# Patient Record
Sex: Male | Born: 1950 | Race: White | Hispanic: No | Marital: Married | Smoking: Never smoker
Health system: Southern US, Community
[De-identification: ages and names within clinical notes are randomized; demographics above are authoritative.]

## PROBLEM LIST (undated history)

## (undated) DIAGNOSIS — Z951 Presence of aortocoronary bypass graft: Secondary | ICD-10-CM

## (undated) DIAGNOSIS — I10 Essential (primary) hypertension: Secondary | ICD-10-CM

## (undated) DIAGNOSIS — Q359 Cleft palate, unspecified: Secondary | ICD-10-CM

## (undated) DIAGNOSIS — Z9889 Other specified postprocedural states: Secondary | ICD-10-CM

## (undated) DIAGNOSIS — K7460 Unspecified cirrhosis of liver: Secondary | ICD-10-CM

## (undated) DIAGNOSIS — I2510 Atherosclerotic heart disease of native coronary artery without angina pectoris: Secondary | ICD-10-CM

## (undated) DIAGNOSIS — I739 Peripheral vascular disease, unspecified: Secondary | ICD-10-CM

## (undated) DIAGNOSIS — N529 Male erectile dysfunction, unspecified: Secondary | ICD-10-CM

## (undated) DIAGNOSIS — I779 Disorder of arteries and arterioles, unspecified: Secondary | ICD-10-CM

## (undated) HISTORY — DX: Presence of aortocoronary bypass graft: Z95.1

## (undated) HISTORY — DX: Disorder of arteries and arterioles, unspecified: I77.9

## (undated) HISTORY — DX: Cleft palate, unspecified: Q35.9

## (undated) HISTORY — DX: Atherosclerotic heart disease of native coronary artery without angina pectoris: I25.10

## (undated) HISTORY — DX: Male erectile dysfunction, unspecified: N52.9

## (undated) HISTORY — PX: CLEFT PALATE REPAIR: SUR1165

## (undated) HISTORY — DX: Essential (primary) hypertension: I10

## (undated) HISTORY — PX: OTHER SURGICAL HISTORY: SHX169

## (undated) HISTORY — DX: Unspecified cirrhosis of liver: K74.60

## (undated) HISTORY — DX: Peripheral vascular disease, unspecified: I73.9

---

## 2008-12-08 ENCOUNTER — Encounter: Payer: Self-pay | Admitting: Cardiology

## 2009-02-05 HISTORY — PX: CORONARY ARTERY BYPASS GRAFT: SHX141

## 2009-02-23 ENCOUNTER — Encounter (INDEPENDENT_AMBULATORY_CARE_PROVIDER_SITE_OTHER): Payer: Self-pay | Admitting: *Deleted

## 2009-02-23 ENCOUNTER — Ambulatory Visit: Payer: Self-pay | Admitting: Cardiology

## 2009-02-23 DIAGNOSIS — R0602 Shortness of breath: Secondary | ICD-10-CM | POA: Insufficient documentation

## 2009-02-23 DIAGNOSIS — R5381 Other malaise: Secondary | ICD-10-CM | POA: Insufficient documentation

## 2009-02-23 DIAGNOSIS — E785 Hyperlipidemia, unspecified: Secondary | ICD-10-CM | POA: Insufficient documentation

## 2009-02-23 DIAGNOSIS — R5383 Other fatigue: Secondary | ICD-10-CM

## 2009-02-24 ENCOUNTER — Encounter: Payer: Self-pay | Admitting: Cardiology

## 2009-02-24 ENCOUNTER — Encounter: Payer: Self-pay | Admitting: Physician Assistant

## 2009-02-26 ENCOUNTER — Ambulatory Visit: Payer: Self-pay | Admitting: Cardiology

## 2009-02-26 ENCOUNTER — Inpatient Hospital Stay (HOSPITAL_COMMUNITY): Admission: AD | Admit: 2009-02-26 | Discharge: 2009-03-08 | Payer: Self-pay | Admitting: Cardiology

## 2009-02-26 ENCOUNTER — Encounter: Payer: Self-pay | Admitting: Cardiology

## 2009-02-26 ENCOUNTER — Ambulatory Visit: Payer: Self-pay | Admitting: Cardiothoracic Surgery

## 2009-02-26 ENCOUNTER — Inpatient Hospital Stay (HOSPITAL_BASED_OUTPATIENT_CLINIC_OR_DEPARTMENT_OTHER): Admission: RE | Admit: 2009-02-26 | Discharge: 2009-02-26 | Payer: Self-pay | Admitting: Cardiology

## 2009-02-28 ENCOUNTER — Encounter: Payer: Self-pay | Admitting: Cardiology

## 2009-03-01 ENCOUNTER — Encounter: Payer: Self-pay | Admitting: Cardiology

## 2009-03-01 ENCOUNTER — Encounter: Payer: Self-pay | Admitting: Cardiothoracic Surgery

## 2009-03-02 ENCOUNTER — Encounter: Payer: Self-pay | Admitting: Cardiology

## 2009-03-03 ENCOUNTER — Encounter: Payer: Self-pay | Admitting: Cardiology

## 2009-03-12 ENCOUNTER — Telehealth: Payer: Self-pay | Admitting: Cardiology

## 2009-03-26 ENCOUNTER — Encounter: Payer: Self-pay | Admitting: Cardiology

## 2009-03-26 ENCOUNTER — Encounter: Admission: RE | Admit: 2009-03-26 | Discharge: 2009-03-26 | Payer: Self-pay | Admitting: Cardiothoracic Surgery

## 2009-03-26 ENCOUNTER — Ambulatory Visit: Payer: Self-pay | Admitting: Cardiothoracic Surgery

## 2009-04-09 ENCOUNTER — Ambulatory Visit: Payer: Self-pay | Admitting: Cardiology

## 2009-04-09 DIAGNOSIS — I2510 Atherosclerotic heart disease of native coronary artery without angina pectoris: Secondary | ICD-10-CM | POA: Insufficient documentation

## 2009-04-12 ENCOUNTER — Encounter: Payer: Self-pay | Admitting: Cardiology

## 2009-05-13 ENCOUNTER — Ambulatory Visit: Payer: Self-pay | Admitting: Cardiology

## 2009-05-26 ENCOUNTER — Telehealth (INDEPENDENT_AMBULATORY_CARE_PROVIDER_SITE_OTHER): Payer: Self-pay | Admitting: *Deleted

## 2009-05-27 ENCOUNTER — Telehealth (INDEPENDENT_AMBULATORY_CARE_PROVIDER_SITE_OTHER): Payer: Self-pay | Admitting: *Deleted

## 2009-06-09 ENCOUNTER — Encounter: Payer: Self-pay | Admitting: Cardiology

## 2009-06-14 ENCOUNTER — Encounter: Payer: Self-pay | Admitting: Cardiology

## 2009-07-07 ENCOUNTER — Encounter: Payer: Self-pay | Admitting: Cardiology

## 2009-08-11 ENCOUNTER — Encounter: Payer: Self-pay | Admitting: Cardiology

## 2009-09-01 ENCOUNTER — Encounter: Payer: Self-pay | Admitting: Cardiology

## 2009-11-23 ENCOUNTER — Ambulatory Visit: Payer: Self-pay | Admitting: Cardiology

## 2010-02-08 ENCOUNTER — Ambulatory Visit: Payer: Self-pay | Admitting: Cardiology

## 2010-06-07 NOTE — Miscellaneous (Signed)
Summary: Rehab Report/ CARDIAC REHAB PROGRESS REPORT  Rehab Report/ CARDIAC REHAB PROGRESS REPORT   Imported By: Dorise Hiss 07/15/2009 15:59:11  _____________________________________________________________________  External Attachment:    Type:   Image     Comment:   External Document

## 2010-06-07 NOTE — Progress Notes (Signed)
Summary: Office Visit/ PATIENT CONCERNS & BLOOD PRESSURE READINGS  Office Visit/ PATIENT CONCERNS & BLOOD PRESSURE READINGS   Imported By: Dorise Hiss 02/08/2010 16:52:18  _____________________________________________________________________  External Attachment:    Type:   Image     Comment:   External Document

## 2010-06-07 NOTE — Assessment & Plan Note (Signed)
Summary: [facility]- PER FAYE HAS TO BE RE -SEEN   Visit Type:  Follow-up Primary Provider:  Fara Chute, MD  CC:  CAD.  History of Present Illness: The patient is seen for followup of coronary artery disease.  He underwent CABG October, 2010.  He had done heavy physical work prior to that.  He has been disabled from doing heavy physical work since that time.  He now hopes to be able to see if he can do some work and he'll be looking into this.  In the meantime he is disabled from heavy physical work.  He has pain in his left shoulder.  I believe this is not cardiac.  Patient also is having difficulties with erectile dysfunction.  I've asked him to see his primary physician concerning this.  Preventive Screening-Counseling & Management  Alcohol-Tobacco     Smoking Status: never  Current Medications (verified): 1)  Multivitamins  Tabs (Multiple Vitamin) .... Take 1 Tablet By Mouth Once A Day 2)  Lisinopril 10 Mg Tabs (Lisinopril) .... Take One Tablet By Mouth Daily 3)  Nitrostat 0.4 Mg Subl (Nitroglycerin) .Marland Kitchen.. 1 Tablet Under Tongue At Onset of Chest Pain; You May Repeat Every 5 Minutes For Up To 3 Doses. 4)  Ecotrin 325 Mg Tbec (Aspirin) .... Take 1 Tablet By Mouth Once A Day 5)  Metoprolol Tartrate 25 Mg Tabs (Metoprolol Tartrate) .... Take 1 Tablet By Mouth Three Times A Day 6)  Simvastatin 40 Mg Tabs (Simvastatin) .... Take 1 Tablet By Mouth Once A Day At Bedtime 7)  Tramadol Hcl 50 Mg Tabs (Tramadol Hcl) .... As Needed Pain 8)  Red Yeast Rice 600 Mg Tabs (Red Yeast Rice Extract) .... Take 1 Tablet By Mouth Two Times A Day 9)  Beano  Tabs (Alpha-D-Galactosidase) .... As Needed When Eats Beans 10)  Pepcid 20 Mg Tabs (Famotidine) .... As Needed Reflux 11)  Tylenol Extra Strength 500 Mg Tabs (Acetaminophen) .... As Needed Pain  Allergies (verified): 1)  * Poison Oak  Comments:  Nurse/Medical Assistant: The patient's medication list and allergies were reviewed with the patient and were  updated in the Medication and Allergy Lists.  Past History:  Past Medical History: CAD.. CABG....X4....03/02/2009.Marland KitchenMarland KitchenMarland KitchenZenaida Niece Trigt EF  65%..echo..03/01/2009 Carotid Artery Disease...doppler..03/01/2009.Marland KitchenMarland Kitchen40-59% RICA,  Fatigue Tiredness HTN  (BP well controlled per Dr. Neita Carp notes) ED skin rash... left leg and left anterior chest Pain left shoulder... October, 2011... probably orthopedic  Review of Systems       Patient denies fever, chills, headache, sweats, rash, change in vision, change in hearing, chest pain, cough, nausea vomiting, urinary symptoms.  All of the systems are reviewed and are negative.  Vital Signs:  Patient profile:   60 year old male Height:      65 inches Weight:      165 pounds BMI:     27.56 Pulse rate:   78 / minute BP sitting:   105 / 72  (left arm) Cuff size:   large  Vitals Entered By: Carlye Grippe (February 08, 2010 2:47 PM)  Nutrition Counseling: Patient's BMI is greater than 25 and therefore counseled on weight management options.  Physical Exam  General:  patient is stable. Eyes:  no xanthelasma. Neck:  no jugular venous distention. Lungs:  lungs are clear.  Respiratory effort is nonlabored. Heart:  cardiac exam reveals S1-S2.  No clicks or significant murmurs. Abdomen:  abdomen is soft. Extremities:  no peripheral edema. Psych:  patient is oriented to person time and place.  Affect is normal.   Impression & Recommendations:  Problem # 1:  FLUID OVERLOAD (ICD-276.6) Fluid status is stable.  No change in therapy.  Problem # 2:  CAROTID ARTERY DISEASE (ICD-433.10)  His updated medication list for this problem includes:    Ecotrin 325 Mg Tbec (Aspirin) .Marland Kitchen... Take 1 tablet by mouth once a day Carotid artery disease is stable.  Problem # 3:  CAD (ICD-414.00)  His updated medication list for this problem includes:    Lisinopril 10 Mg Tabs (Lisinopril) .Marland Kitchen... Take one tablet by mouth daily    Nitrostat 0.4 Mg Subl (Nitroglycerin) .Marland Kitchen...  1 tablet under tongue at onset of chest pain; you may repeat every 5 minutes for up to 3 doses.    Ecotrin 325 Mg Tbec (Aspirin) .Marland Kitchen... Take 1 tablet by mouth once a day    Metoprolol Tartrate 25 Mg Tabs (Metoprolol tartrate) .Marland Kitchen... Take 1 tablet by mouth three times a day Coronary disease is stable post CABG.  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  He is a candidate to do some work.  He will be looking into what he could be improved for.  Problem # 4:  * LEFT SHOULDER PAIN I believe this is orthopedic.  I've encouraged him to see his primary physician.  Problem # 5:  * ERECTILE DYSFUNCTION I have encouraged the patient to see his primary physician.  Patient Instructions: 1)  Your physician wants you to follow-up in: 6 months. You will receive a reminder letter in the mail one-two months in advance. If you don't receive a letter, please call our office to schedule the follow-up appointment. 2)  Your physician recommends that you continue on your current medications as directed. Please refer to the Current Medication list given to you today.

## 2010-06-07 NOTE — Miscellaneous (Signed)
Summary: Rehab Report/ CARDIAC REHAB PROGRESS REPORT  Rehab Report/ CARDIAC REHAB PROGRESS REPORT   Imported By: Dorise Hiss 08/12/2009 14:36:53  _____________________________________________________________________  External Attachment:    Type:   Image     Comment:   External Document

## 2010-06-07 NOTE — Assessment & Plan Note (Signed)
Summary: 1 MO FU -SRS   Visit Type:  Follow-up Primary Provider:  Fara Chute, MD  CC:  CAD.  History of Present Illness: The patient is seen in followup of coronary artery disease.  He is post CABG period I saw him last April 09, 2009.  At that time plans were made for an additional 2 weeks of a diuretic.  He is diuresed and is doing very well.  He is going about his activities.  Is not having chest pain.  Preventive Screening-Counseling & Management  Alcohol-Tobacco     Smoking Status: never  Current Medications (verified): 1)  Multivitamins  Tabs (Multiple Vitamin) .... Take 1 Tablet By Mouth Once A Day 2)  Lisinopril 10 Mg Tabs (Lisinopril) .... Take One Tablet By Mouth Daily 3)  Nitrostat 0.4 Mg Subl (Nitroglycerin) .Marland Kitchen.. 1 Tablet Under Tongue At Onset of Chest Pain; You May Repeat Every 5 Minutes For Up To 3 Doses. 4)  Ecotrin 325 Mg Tbec (Aspirin) .... Take 1 Tablet By Mouth Once A Day 5)  Metoprolol Tartrate 25 Mg Tabs (Metoprolol Tartrate) .... Take 1 Tablet By Mouth Three Times A Day 6)  Simvastatin 40 Mg Tabs (Simvastatin) .... Take 1 Tablet By Mouth Once A Day At Bedtime 7)  Tramadol Hcl 50 Mg Tabs (Tramadol Hcl) .... As Needed Pain 8)  Red Yeast Rice 600 Mg Tabs (Red Yeast Rice Extract) .... Take 1 Tablet By Mouth Two Times A Day 9)  Beano  Tabs (Alpha-D-Galactosidase) .... As Needed When Eats Beans 10)  Pepcid 20 Mg Tabs (Famotidine) .... As Needed Reflux 11)  Tylenol Extra Strength 500 Mg Tabs (Acetaminophen) .... As Needed Pain  Allergies: 1)  * Poison Oak  Comments:  Nurse/Medical Assistant: The patient's medications were reviewed with the patient and were updated in the Medication List. Pt brought medications to office visit.  Past History:  Past Medical History: Last updated: 04/09/2009 CAD.. CABG....X4....03/02/2009.Marland KitchenMarland KitchenMarland KitchenZenaida Niece Trigt EF  65%..echo..03/01/2009 Carotid Artery Disease...doppler..03/01/2009.Marland KitchenMarland Kitchen40-59% RICA,  Fatigue Tiredness HTN  (BP well  controlled per Dr. Neita Carp notes) ED  Review of Systems       Patient denies fever, chills, headache, sweats, rash, change in vision, change in hearing, chest pain, shortness of breath, cough, nausea vomiting, urinary symptoms.  All other systems are reviewed and are negative.  Vital Signs:  Patient profile:   60 year old male Height:      65 inches Weight:      171.75 pounds BMI:     28.68 Pulse rate:   86 / minute BP sitting:   132 / 84  (left arm) Cuff size:   regular  Vitals Entered By: Cyril Loosen, RN, BSN (May 13, 2009 2:44 PM)  Nutrition Counseling: Patient's BMI is greater than 25 and therefore counseled on weight management options. CC: CAD Comments Follow up visit. Pt states he has some soreness with movement in his chest from surgery. He also states he has a rash on his leg and wonders if this could be from the red yeast rice.   Physical Exam  General:  in general the patient is stable today. Eyes:  no xanthelasma. Neck:  no jugular venous distention. Lungs:  lungs are clear.  Respiratory effort is not labored. Heart:  cardiac exam reveals S1-S2.  No clicks or significant murmurs. Abdomen:  abdomen soft. Extremities:  no peripheral edema. Skin:  no skin rashes. Psych:  patient is oriented to person time and place.  Affect is normal.   Impression &  Recommendations:  Problem # 1:  FLUID OVERLOAD (ICD-276.6) Fluid status is stable.  No change in therapy.  Problem # 2:  CAD (ICD-414.00) Coronary disease is stable.  No changes.  Problem # 3:  SHORTNESS OF BREATH (ICD-786.05) Is not having ongoing shortness of breath.  No further workup is needed.  Patient Instructions: 1)  Your physician wants you to follow-up in: 6months.  You will receive a reminder letter in the mail about two months in advance. If you don't receive a letter, please call our office to schedule the follow-up appointment. 2)  Your physician recommends that you continue on your current  medications as directed. Please refer to the Current Medication list given to you today.

## 2010-06-07 NOTE — Medication Information (Signed)
Summary: RX Folder/ metoprolol  RX Folder/ metoprolol   Imported By: Dorise Hiss 02/08/2010 16:47:32  _____________________________________________________________________  External Attachment:    Type:   Image     Comment:   External Document

## 2010-06-07 NOTE — Progress Notes (Signed)
Summary: NEED RETURN TO WORK STATUS  Phone Note Other Incoming Call back at [phone]   Caller: Katrina with [employer] Summary of Call: Katrina left message on machine requesting call regarding patient's status of being out of work. Requested if patient was able to return to work and if so,needed date faxed to them. Please advise. Initial call taken by: Carlye Grippe,  May 26, 2009 10:53 AM  Follow-up for Phone Call        called and left message on Katrina's voicemail that message sent to MD and I would call her back once a response is given.  Follow-up by: Carlye Grippe,  May 27, 2009 12:34 PM  Additional Follow-up for Phone Call Additional follow up Details #1::        pt had CABG in October '10. pt needs to get clearance from his surgeon Additional Follow-up by: Nelida Meuse, PA-C,  May 27, 2009 12:54 PM    Additional Follow-up for Phone Call Additional follow up Details #2::    Katrina informed of the above.  Follow-up by: Carlye Grippe,  May 27, 2009 2:19 PM

## 2010-06-07 NOTE — Progress Notes (Signed)
  Phone Note Other Incoming   Caller: catrina Neurosurgeon of Call: catrina from [employer] would like a phone call back to know if Kyle Boyd [birth year] has returned to work . her number is [phone] extension [phone] Initial call taken by: Dreama Saa, CNA,  May 27, 2009 9:51 AM  Follow-up for Phone Call        message taken in previous phone note and will be followed there. Follow-up by: Carlye Grippe,  May 27, 2009 12:34 PM

## 2010-06-07 NOTE — Miscellaneous (Signed)
Summary: Rehab Report/ CARDIAC REHAB PROGRESS  REPORT  Rehab Report/ CARDIAC REHAB PROGRESS  REPORT   Imported By: Dorise Hiss 06/15/2009 08:24:48  _____________________________________________________________________  External Attachment:    Type:   Image     Comment:   External Document

## 2010-06-07 NOTE — Assessment & Plan Note (Signed)
Summary: 6 MO FU PER JULY REMINDER-SRS   Visit Type:  Follow-up Primary Provider:  Fara Chute, MD  CC:  CAD.  History of Present Illness: The patient returns for followup of coronary artery disease. I saw him last in January, 2011. He underwent CABG in October, 2010. Fortunately he is recovering nicely. He does have carotid artery disease and is being followed. He is not having any significant chest pain.  He's been bothered by a rash on his left leg and left anterior chest. He has seen Dr.Sasser for this.  Preventive Screening-Counseling & Management  Alcohol-Tobacco     Smoking Status: never  Current Medications (verified): 1)  Multivitamins  Tabs (Multiple Vitamin) .... Take 1 Tablet By Mouth Once A Day 2)  Lisinopril 10 Mg Tabs (Lisinopril) .... Take One Tablet By Mouth Daily 3)  Nitrostat 0.4 Mg Subl (Nitroglycerin) .Marland Kitchen.. 1 Tablet Under Tongue At Onset of Chest Pain; You May Repeat Every 5 Minutes For Up To 3 Doses. 4)  Ecotrin 325 Mg Tbec (Aspirin) .... Take 1 Tablet By Mouth Once A Day 5)  Metoprolol Tartrate 25 Mg Tabs (Metoprolol Tartrate) .... Take 1 Tablet By Mouth Three Times A Day 6)  Simvastatin 40 Mg Tabs (Simvastatin) .... Take 1 Tablet By Mouth Once A Day At Bedtime 7)  Tramadol Hcl 50 Mg Tabs (Tramadol Hcl) .... As Needed Pain 8)  Red Yeast Rice 600 Mg Tabs (Red Yeast Rice Extract) .... Take 1 Tablet By Mouth Two Times A Day 9)  Beano  Tabs (Alpha-D-Galactosidase) .... As Needed When Eats Beans 10)  Pepcid 20 Mg Tabs (Famotidine) .... As Needed Reflux 11)  Tylenol Extra Strength 500 Mg Tabs (Acetaminophen) .... As Needed Pain  Allergies: 1)  * Poison Oak  Past History:  Past Medical History: CAD.. CABG....X4....03/02/2009.Marland KitchenMarland KitchenMarland KitchenZenaida Niece Trigt EF  65%..echo..03/01/2009 Carotid Artery Disease...doppler..03/01/2009.Marland KitchenMarland Kitchen40-59% RICA,  Fatigue Tiredness HTN  (BP well controlled per Dr. Neita Carp notes) ED skin rash... left leg and left anterior chest  Review of  Systems       Patient denies fever, chills, sweats, headache, change in vision, change in hearing, chest pain, cough, nausea vomiting, urinary symptoms. All other systems are reviewed and are negative.  Vital Signs:  Patient profile:   60 year old male Height:      65 inches Weight:      165.75 pounds Pulse rate:   63 / minute BP sitting:   108 / 73  (left arm) Cuff size:   regular  Vitals Entered By: Hoover Brunette, LPN (November 23, 2009 11:04 AM) CC: CAD Is Patient Diabetic? No Comments 6 month f/u   Physical Exam  General:  Patient is stable. Eyes:  no xanthelasma. Neck:  no jugular venous distention. Lungs:  lungs are clear respiratory effort is nonlabored. Heart:  cardiac exam reveals S1-S2. No clicks or significant murmurs. Abdomen:  abdomen is soft. Msk:  no musculoskeletal deformities. Extremities:  no peripheral edema. Psych:  patient is oriented to person time and place. Affect is normal.   Impression & Recommendations:  Problem # 1:  * SKIN RASH The rash is being treated with a cream. The patient says that there is some improvement. This does not appear to be a drug rash.  Problem # 2:  FLUID OVERLOAD (ICD-276.6) Volume status is stable. No change in therapy.  Problem # 3:  CAROTID ARTERY DISEASE (ICD-433.10) The patient will make followup carotid Dopplers in October, 2011  Problem # 4:  CAD (ICD-414.00) Coronary disease is  stable. No change in therapy.  Problem # 5:  HYPERTENSION, CONTROLLED (ICD-401.1) Blood pressure is under good control. No change in therapy.  Patient Instructions: 1)  Your physician wants you to follow-up in: 1 year. You will receive a reminder letter in the mail one-two months in advance. If you don't receive a letter, please call our office to schedule the follow-up appointment. 2)  Your physician recommends that you continue on your current medications as directed. Please refer to the Current Medication list given to you today.

## 2010-06-07 NOTE — Miscellaneous (Signed)
Summary: Rehab Report/ CARDIAC REHAB DISCHARGE SUMMARY  Rehab Report/ CARDIAC REHAB DISCHARGE SUMMARY   Imported By: Dorise Hiss 09/08/2009 15:48:24  _____________________________________________________________________  External Attachment:    Type:   Image     Comment:   External Document

## 2010-07-20 ENCOUNTER — Encounter: Payer: Self-pay | Admitting: *Deleted

## 2010-08-11 LAB — POCT I-STAT 4, (NA,K, GLUC, HGB,HCT)
Glucose, Bld: 112 mg/dL — ABNORMAL HIGH (ref 70–99)
Glucose, Bld: 153 mg/dL — ABNORMAL HIGH (ref 70–99)
Glucose, Bld: 174 mg/dL — ABNORMAL HIGH (ref 70–99)
Glucose, Bld: 85 mg/dL (ref 70–99)
HCT: 27 % — ABNORMAL LOW (ref 39.0–52.0)
HCT: 29 % — ABNORMAL LOW (ref 39.0–52.0)
HCT: 33 % — ABNORMAL LOW (ref 39.0–52.0)
HCT: 39 % (ref 39.0–52.0)
Hemoglobin: 11.2 g/dL — ABNORMAL LOW (ref 13.0–17.0)
Hemoglobin: 13.3 g/dL (ref 13.0–17.0)
Hemoglobin: 9.2 g/dL — ABNORMAL LOW (ref 13.0–17.0)
Potassium: 4 mEq/L (ref 3.5–5.1)
Potassium: 4.3 mEq/L (ref 3.5–5.1)
Potassium: 4.4 mEq/L (ref 3.5–5.1)
Potassium: 5.1 mEq/L (ref 3.5–5.1)
Potassium: 5.1 mEq/L (ref 3.5–5.1)
Sodium: 126 mEq/L — ABNORMAL LOW (ref 135–145)
Sodium: 129 mEq/L — ABNORMAL LOW (ref 135–145)
Sodium: 131 mEq/L — ABNORMAL LOW (ref 135–145)
Sodium: 137 mEq/L (ref 135–145)
Sodium: 138 mEq/L (ref 135–145)
Sodium: 139 mEq/L (ref 135–145)

## 2010-08-11 LAB — CBC
HCT: 24.3 % — ABNORMAL LOW (ref 39.0–52.0)
HCT: 25.2 % — ABNORMAL LOW (ref 39.0–52.0)
HCT: 26.9 % — ABNORMAL LOW (ref 39.0–52.0)
HCT: 27.1 % — ABNORMAL LOW (ref 39.0–52.0)
HCT: 27.3 % — ABNORMAL LOW (ref 39.0–52.0)
HCT: 30.9 % — ABNORMAL LOW (ref 39.0–52.0)
HCT: 42.4 % (ref 39.0–52.0)
HCT: 42.5 % (ref 39.0–52.0)
HCT: 44.6 % (ref 39.0–52.0)
Hemoglobin: 10.7 g/dL — ABNORMAL LOW (ref 13.0–17.0)
Hemoglobin: 14.7 g/dL (ref 13.0–17.0)
Hemoglobin: 14.9 g/dL (ref 13.0–17.0)
Hemoglobin: 15.2 g/dL (ref 13.0–17.0)
Hemoglobin: 15.4 g/dL (ref 13.0–17.0)
Hemoglobin: 8.6 g/dL — ABNORMAL LOW (ref 13.0–17.0)
Hemoglobin: 9 g/dL — ABNORMAL LOW (ref 13.0–17.0)
Hemoglobin: 9.5 g/dL — ABNORMAL LOW (ref 13.0–17.0)
Hemoglobin: 9.5 g/dL — ABNORMAL LOW (ref 13.0–17.0)
Hemoglobin: 9.6 g/dL — ABNORMAL LOW (ref 13.0–17.0)
MCHC: 34.6 g/dL (ref 30.0–36.0)
MCHC: 34.6 g/dL (ref 30.0–36.0)
MCHC: 34.8 g/dL (ref 30.0–36.0)
MCHC: 34.9 g/dL (ref 30.0–36.0)
MCHC: 35 g/dL (ref 30.0–36.0)
MCHC: 35.2 g/dL (ref 30.0–36.0)
MCHC: 35.3 g/dL (ref 30.0–36.0)
MCHC: 35.5 g/dL (ref 30.0–36.0)
MCHC: 35.6 g/dL (ref 30.0–36.0)
MCV: 88.8 fL (ref 78.0–100.0)
MCV: 88.8 fL (ref 78.0–100.0)
MCV: 88.9 fL (ref 78.0–100.0)
MCV: 89 fL (ref 78.0–100.0)
MCV: 89 fL (ref 78.0–100.0)
MCV: 89.1 fL (ref 78.0–100.0)
MCV: 89.3 fL (ref 78.0–100.0)
MCV: 89.4 fL (ref 78.0–100.0)
MCV: 89.6 fL (ref 78.0–100.0)
Platelets: 100 10*3/uL — ABNORMAL LOW (ref 150–400)
Platelets: 101 10*3/uL — ABNORMAL LOW (ref 150–400)
Platelets: 104 10*3/uL — ABNORMAL LOW (ref 150–400)
Platelets: 106 10*3/uL — ABNORMAL LOW (ref 150–400)
Platelets: 129 10*3/uL — ABNORMAL LOW (ref 150–400)
Platelets: 94 10*3/uL — ABNORMAL LOW (ref 150–400)
RBC: 2.74 MIL/uL — ABNORMAL LOW (ref 4.22–5.81)
RBC: 2.81 MIL/uL — ABNORMAL LOW (ref 4.22–5.81)
RBC: 3.01 MIL/uL — ABNORMAL LOW (ref 4.22–5.81)
RBC: 3.04 MIL/uL — ABNORMAL LOW (ref 4.22–5.81)
RBC: 3.05 MIL/uL — ABNORMAL LOW (ref 4.22–5.81)
RBC: 3.47 MIL/uL — ABNORMAL LOW (ref 4.22–5.81)
RBC: 4.77 MIL/uL (ref 4.22–5.81)
RBC: 4.79 MIL/uL (ref 4.22–5.81)
RDW: 13.5 % (ref 11.5–15.5)
RDW: 13.6 % (ref 11.5–15.5)
RDW: 13.6 % (ref 11.5–15.5)
RDW: 13.8 % (ref 11.5–15.5)
RDW: 13.8 % (ref 11.5–15.5)
RDW: 13.9 % (ref 11.5–15.5)
RDW: 14.1 % (ref 11.5–15.5)
RDW: 14.1 % (ref 11.5–15.5)
WBC: 10 10*3/uL (ref 4.0–10.5)
WBC: 11.5 10*3/uL — ABNORMAL HIGH (ref 4.0–10.5)
WBC: 12.5 10*3/uL — ABNORMAL HIGH (ref 4.0–10.5)
WBC: 12.7 10*3/uL — ABNORMAL HIGH (ref 4.0–10.5)
WBC: 14.3 10*3/uL — ABNORMAL HIGH (ref 4.0–10.5)
WBC: 15.7 10*3/uL — ABNORMAL HIGH (ref 4.0–10.5)
WBC: 7.6 10*3/uL (ref 4.0–10.5)

## 2010-08-11 LAB — BASIC METABOLIC PANEL
BUN: 12 mg/dL (ref 6–23)
BUN: 16 mg/dL (ref 6–23)
BUN: 18 mg/dL (ref 6–23)
CO2: 22 mEq/L (ref 19–32)
CO2: 25 mEq/L (ref 19–32)
CO2: 26 mEq/L (ref 19–32)
Calcium: 7.9 mg/dL — ABNORMAL LOW (ref 8.4–10.5)
Calcium: 8.2 mg/dL — ABNORMAL LOW (ref 8.4–10.5)
Calcium: 8.4 mg/dL (ref 8.4–10.5)
Chloride: 102 mEq/L (ref 96–112)
Chloride: 110 mEq/L (ref 96–112)
Chloride: 98 mEq/L (ref 96–112)
Creatinine, Ser: 0.81 mg/dL (ref 0.4–1.5)
Creatinine, Ser: 0.97 mg/dL (ref 0.4–1.5)
Creatinine, Ser: 0.98 mg/dL (ref 0.4–1.5)
GFR calc Af Amer: >60 mL/min (ref >60)
GFR calc Af Amer: >60 mL/min (ref >60)
GFR calc Af Amer: >60 mL/min (ref >60)
GFR calc non Af Amer: >60 mL/min (ref >60)
GFR calc non Af Amer: >60 mL/min (ref >60)
GFR calc non Af Amer: >60 mL/min (ref >60)
Glucose, Bld: 102 mg/dL — ABNORMAL HIGH (ref 70–99)
Glucose, Bld: 168 mg/dL — ABNORMAL HIGH (ref 70–99)
Glucose, Bld: 170 mg/dL — ABNORMAL HIGH (ref 70–99)
Potassium: 4.2 mEq/L (ref 3.5–5.1)
Potassium: 4.3 mEq/L (ref 3.5–5.1)
Potassium: 4.4 mEq/L (ref 3.5–5.1)
Sodium: 130 mEq/L — ABNORMAL LOW (ref 135–145)
Sodium: 134 mEq/L — ABNORMAL LOW (ref 135–145)
Sodium: 139 mEq/L (ref 135–145)

## 2010-08-11 LAB — MAGNESIUM
Magnesium: 2.1 mg/dL (ref 1.5–2.5)
Magnesium: 2.3 mg/dL (ref 1.5–2.5)
Magnesium: 2.7 mg/dL — ABNORMAL HIGH (ref 1.5–2.5)

## 2010-08-11 LAB — POCT I-STAT 3, ART BLOOD GAS (G3+)
Acid-Base Excess: 2 mmol/L (ref 0.0–2.0)
Acid-base deficit: 3 mmol/L — ABNORMAL HIGH (ref 0.0–2.0)
Acid-base deficit: 4 mmol/L — ABNORMAL HIGH (ref 0.0–2.0)
Bicarbonate: 20.8 mEq/L (ref 20.0–24.0)
Bicarbonate: 22.3 mEq/L (ref 20.0–24.0)
Bicarbonate: 22.6 mEq/L (ref 20.0–24.0)
Bicarbonate: 23.9 mEq/L (ref 20.0–24.0)
O2 Saturation: 100 %
O2 Saturation: 100 %
O2 Saturation: 100 %
O2 Saturation: 99 %
Patient temperature: 38
TCO2: 24 mmol/L (ref 0–100)
TCO2: 24 mmol/L (ref 0–100)
TCO2: 25 mmol/L (ref 0–100)
TCO2: 26 mmol/L (ref 0–100)
pCO2 arterial: 37.3 mmHg (ref 35.0–45.0)
pCO2 arterial: 38.8 mmHg (ref 35.0–45.0)
pCO2 arterial: 40.9 mmHg (ref 35.0–45.0)
pCO2 arterial: 41.2 mmHg (ref 35.0–45.0)
pH, Arterial: 7.347 — ABNORMAL LOW (ref 7.350–7.450)
pH, Arterial: 7.359 (ref 7.350–7.450)
pH, Arterial: 7.366 (ref 7.350–7.450)
pH, Arterial: 7.385 (ref 7.350–7.450)
pO2, Arterial: 139 mmHg — ABNORMAL HIGH (ref 80.0–100.0)
pO2, Arterial: 141 mmHg — ABNORMAL HIGH (ref 80.0–100.0)
pO2, Arterial: 445 mmHg — ABNORMAL HIGH (ref 80.0–100.0)
pO2, Arterial: 65 mmHg — ABNORMAL LOW (ref 80.0–100.0)

## 2010-08-11 LAB — COMPREHENSIVE METABOLIC PANEL
Alkaline Phosphatase: 47 U/L (ref 39–117)
BUN: 12 mg/dL (ref 6–23)
BUN: 15 mg/dL (ref 6–23)
CO2: 23 mEq/L (ref 19–32)
Calcium: 9.1 mg/dL (ref 8.4–10.5)
Creatinine, Ser: 0.84 mg/dL (ref 0.4–1.5)
Creatinine, Ser: 0.98 mg/dL (ref 0.4–1.5)
GFR calc Af Amer: >60 mL/min (ref >60)
Glucose, Bld: 106 mg/dL — ABNORMAL HIGH (ref 70–99)
Potassium: 4.2 mEq/L (ref 3.5–5.1)
Total Bilirubin: 0.7 mg/dL (ref 0.3–1.2)
Total Bilirubin: 0.8 mg/dL (ref 0.3–1.2)
Total Protein: 6.8 g/dL (ref 6.0–8.3)
Total Protein: 6.9 g/dL (ref 6.0–8.3)

## 2010-08-11 LAB — APTT: aPTT: 39 seconds — ABNORMAL HIGH (ref 24–37)

## 2010-08-11 LAB — CREATININE, SERUM
Creatinine, Ser: 0.95 mg/dL (ref 0.4–1.5)
Creatinine, Ser: 0.97 mg/dL (ref 0.4–1.5)
GFR calc Af Amer: >60 mL/min (ref >60)
GFR calc Af Amer: >60 mL/min (ref >60)
GFR calc non Af Amer: >60 mL/min (ref >60)
GFR calc non Af Amer: >60 mL/min (ref >60)

## 2010-08-11 LAB — POCT I-STAT, CHEM 8
BUN: 13 mg/dL (ref 6–23)
Calcium, Ion: 1.12 mmol/L (ref 1.12–1.32)
Calcium, Ion: 1.19 mmol/L (ref 1.12–1.32)
Chloride: 107 mEq/L (ref 96–112)
Glucose, Bld: 147 mg/dL — ABNORMAL HIGH (ref 70–99)
HCT: 28 % — ABNORMAL LOW (ref 39.0–52.0)
HCT: 34 % — ABNORMAL LOW (ref 39.0–52.0)
Hemoglobin: 9.5 g/dL — ABNORMAL LOW (ref 13.0–17.0)
Sodium: 139 mEq/L (ref 135–145)
TCO2: 23 mmol/L (ref 0–100)

## 2010-08-11 LAB — POCT I-STAT 3, VENOUS BLOOD GAS (G3P V)
TCO2: 24 mmol/L (ref 0–100)
pCO2, Ven: 39.9 mmHg — ABNORMAL LOW (ref 45.0–50.0)
pH, Ven: 7.369 — ABNORMAL HIGH (ref 7.250–7.300)

## 2010-08-11 LAB — GLUCOSE, CAPILLARY
Glucose-Capillary: 113 mg/dL — ABNORMAL HIGH (ref 70–99)
Glucose-Capillary: 114 mg/dL — ABNORMAL HIGH (ref 70–99)
Glucose-Capillary: 133 mg/dL — ABNORMAL HIGH (ref 70–99)
Glucose-Capillary: 147 mg/dL — ABNORMAL HIGH (ref 70–99)
Glucose-Capillary: 147 mg/dL — ABNORMAL HIGH (ref 70–99)
Glucose-Capillary: 152 mg/dL — ABNORMAL HIGH (ref 70–99)
Glucose-Capillary: 166 mg/dL — ABNORMAL HIGH (ref 70–99)
Glucose-Capillary: 171 mg/dL — ABNORMAL HIGH (ref 70–99)
Glucose-Capillary: 81 mg/dL (ref 70–99)

## 2010-08-11 LAB — URINALYSIS, ROUTINE W REFLEX MICROSCOPIC
Nitrite: NEGATIVE
Specific Gravity, Urine: 1.017 (ref 1.005–1.030)
pH: 6.5 (ref 5.0–8.0)

## 2010-08-11 LAB — PROTIME-INR
INR: 1.01 (ref 0.00–1.49)
INR: 1.5 — ABNORMAL HIGH (ref 0.00–1.49)
Prothrombin Time: 18 seconds — ABNORMAL HIGH (ref 11.6–15.2)

## 2010-08-11 LAB — CROSSMATCH
ABO/RH(D): A NEG
Antibody Screen: NEGATIVE

## 2010-08-11 LAB — LIPID PANEL: Cholesterol: 158 mg/dL (ref 0–200)

## 2010-08-11 LAB — ABO/RH: ABO/RH(D): A NEG

## 2010-08-11 LAB — HEPARIN LEVEL (UNFRACTIONATED)
Heparin Unfractionated: 0.44 IU/mL (ref 0.30–0.70)
Heparin Unfractionated: 0.66 IU/mL (ref 0.30–0.70)

## 2010-08-11 LAB — PLATELET COUNT: Platelets: 130 10*3/uL — ABNORMAL LOW (ref 150–400)

## 2010-08-11 LAB — HEMOGLOBIN A1C
Hgb A1c MFr Bld: 5.5 % (ref 4.6–6.1)
Mean Plasma Glucose: 111 mg/dL

## 2010-08-11 LAB — URINE MICROSCOPIC-ADD ON

## 2010-08-11 LAB — HEMOGLOBIN AND HEMATOCRIT, BLOOD: HCT: 31 % — ABNORMAL LOW (ref 39.0–52.0)

## 2010-08-11 LAB — MRSA PCR SCREENING: MRSA by PCR: NEGATIVE

## 2010-08-11 LAB — DIGOXIN LEVEL: Digoxin Level: 0.3 ng/mL — ABNORMAL LOW (ref 0.8–2.0)

## 2010-08-15 ENCOUNTER — Encounter: Payer: Self-pay | Admitting: Cardiology

## 2010-08-15 DIAGNOSIS — I739 Peripheral vascular disease, unspecified: Secondary | ICD-10-CM

## 2010-08-15 DIAGNOSIS — N529 Male erectile dysfunction, unspecified: Secondary | ICD-10-CM | POA: Insufficient documentation

## 2010-08-15 DIAGNOSIS — IMO0001 Reserved for inherently not codable concepts without codable children: Secondary | ICD-10-CM | POA: Insufficient documentation

## 2010-08-15 DIAGNOSIS — Z951 Presence of aortocoronary bypass graft: Secondary | ICD-10-CM | POA: Insufficient documentation

## 2010-08-15 DIAGNOSIS — I10 Essential (primary) hypertension: Secondary | ICD-10-CM | POA: Insufficient documentation

## 2010-08-15 DIAGNOSIS — I779 Disorder of arteries and arterioles, unspecified: Secondary | ICD-10-CM | POA: Insufficient documentation

## 2010-08-15 DIAGNOSIS — E8770 Fluid overload, unspecified: Secondary | ICD-10-CM | POA: Insufficient documentation

## 2010-08-16 ENCOUNTER — Encounter: Payer: Self-pay | Admitting: Cardiology

## 2010-08-16 ENCOUNTER — Ambulatory Visit (INDEPENDENT_AMBULATORY_CARE_PROVIDER_SITE_OTHER): Payer: Self-pay | Admitting: Cardiology

## 2010-08-16 DIAGNOSIS — I10 Essential (primary) hypertension: Secondary | ICD-10-CM

## 2010-08-16 DIAGNOSIS — N529 Male erectile dysfunction, unspecified: Secondary | ICD-10-CM

## 2010-08-16 DIAGNOSIS — I2510 Atherosclerotic heart disease of native coronary artery without angina pectoris: Secondary | ICD-10-CM

## 2010-08-16 NOTE — Assessment & Plan Note (Signed)
The patient has erectile dysfunction that is evaluated by his primary physician.  It will be okay for him to use Viagra as long as he does not use nitroglycerin at the same time.  I have instructed him about this very carefully.  I'll see him for cardiology followup in one year.  It will be time to repeat carotid Doppler at that time.

## 2010-08-16 NOTE — Patient Instructions (Signed)
Your physician you to follow up in 1 year. You will receive a reminder letter in the mail one-two months in advance. If you don't receive a letter, please call our office to schedule the follow-up appointment. Your physician recommends that you continue on your current medications as directed. Please refer to the Current Medication list given to you today. 

## 2010-08-16 NOTE — Assessment & Plan Note (Signed)
Blood pressure is controlled. No change in therapy. 

## 2010-08-16 NOTE — Progress Notes (Signed)
HPI Patient is seen for follow up of coronary disease.  He underwent CABG in October, 2010.  He is doing well.  Is not having any significant chest pain.  He is having some problems with erectile dysfunction.  He is not using nitroglycerin.  No Known Allergies  Current Outpatient Prescriptions  Medication Sig Dispense Refill  . acetaminophen (TYLENOL) 500 MG tablet Take 500 mg by mouth every 6 (six) hours as needed.        . Alpha-D-Galactosidase (BEANO PO) Take 1 tablet by mouth as needed.        Marland Kitchen aspirin 325 MG tablet Take 325 mg by mouth daily.        . famotidine (PEPCID) 20 MG tablet Take 20 mg by mouth as needed.        Marland Kitchen lisinopril (PRINIVIL,ZESTRIL) 10 MG tablet Take 10 mg by mouth daily.        . metoprolol tartrate (LOPRESSOR) 25 MG tablet Take 25 mg by mouth 2 (two) times daily.        . Multiple Vitamins-Minerals (MULTIVITAMIN WITH MINERALS) tablet Take 1 tablet by mouth daily.        . nitroGLYCERIN (NITROSTAT) 0.4 MG SL tablet Place 0.4 mg under the tongue every 5 (five) minutes as needed.        . Red Yeast Rice 600 MG CAPS Take 1 capsule by mouth 2 (two) times daily.        . simvastatin (ZOCOR) 40 MG tablet Take 40 mg by mouth at bedtime.        . traMADol (ULTRAM) 50 MG tablet Take 50-100 mg by mouth every 4 (four) hours.         History   Social History  . Marital Status: Married    Spouse Name: N/A    Number of Children: 2  . Years of Education: N/A   Occupational History  . full time     Cabin crew facility in [place]   Social History Main Topics  . Smoking status: Never Smoker   . Smokeless tobacco: Not on file  . Alcohol Use: No  . Drug Use: No  . Sexually Active: Not on file   Other Topics Concern  . Not on file   Social History Narrative  . No narrative on file    No family history on file.  Past Medical History  Diagnosis Date  . CAD (coronary artery disease)   . Normal echocardiogram     EF 65%..echo...03/01/2009  . Carotid  artery disease     doppler...03/01/2009.Marland KitchenMarland Kitchen40-59%RICA  . Fatigue   . Tiredness   . Hypertension     well controlled per Dr.Sasser notes  . Skin rash     left leg and left anterior chest  . Left shoulder pain     october 2011.Marland KitchenMarland Kitchenprobably orthopedic  . Fluid overload   . Hx of CABG     October, 2010  . Erectile dysfunction     Past Surgical History  Procedure Date  . Coronary artery bypass graft     ...x4....03/02/2009....Dr.Van Tright    ROS  Patient denies fever, chills, headache, sweats, rash, change in vision, change in hearing, chest pain, cough, nausea vomiting, urinary symptoms.  All other systems are reviewed and are negative.  PHYSICAL EXAM Patient is oriented to person time and place.  Affect is normal.  Head is atraumatic.  There are no carotid bruits.  Lungs are clear cardiac respiratory effort is not labored.  Cardiac exam  reveals S1-S2.  No clicks or significant murmurs.  The abdomen is soft there is no peripheral edema.  Filed Vitals:   08/16/10 0830  BP: 124/84  Pulse: 66  Height: 5\' 5"  (1.651 m)  Weight: 172 lb (78.019 kg)    EKG  EKG is done today and reviewed by me.  It is normal.  ASSESSMENT & PLAN

## 2010-08-16 NOTE — Assessment & Plan Note (Signed)
Coronary disease is stable.  EKG is normal.  No change in therapy.

## 2010-09-05 ENCOUNTER — Encounter: Payer: Self-pay | Admitting: Cardiology

## 2010-09-20 NOTE — Assessment & Plan Note (Signed)
OFFICE VISIT   EPIC, TRIBBETT  DOB:  August 15, 1950                                        March 26, 2009  CHART #:  [id number]   CURRENT PROBLEMS:  1. Status post coronary artery bypass graft x4 on March 02, 2009,      for severe multivessel coronary artery disease with a class 1      stable angina.  2. Hypertension.   HISTORY OF PRESENT ILLNESS:  The patient is a 60 year old nonsmoker who  returns for his first office visit after multivessel bypass grafting for  unstable angina and multivessel disease.  He presented with progressive  chest discomfort, nausea, and dyspnea.  He had EKG changes and he  underwent cardiac catheterization at [facility].  This demonstrated severe  disease of the LAD, diagonal, and right coronaries.  His EF was normal.  He subsequently underwent left IMA grafting to the LAD and vein grafts  to the diagonal, circumflex marginal, and distal RCA.  Postoperatively,  he had difficulty with deconditioning and tachycardia but was discharged  home ambulating 300 feet in a sinus rhythm.  Heart rate of 100-105.   CURRENT MEDICATIONS:  Digoxin, Ultram, aspirin, Lopressor 25 t.i.d., and  Zocor 40 mg at bedtime.  He finished a short course of Lasix and  potassium.  Since returning home, he has had no recurrent chest pain and  is stronger.  He is walking half hour daily.  The incisions are healing  well.  His left leg is still swollen and somewhat uncomfortable from the  endovascular vein harvest.   PHYSICAL EXAMINATION:  Blood pressure 138/90, pulse 90 and regular,  respirations 18, saturation 98%.  He is alert and comfortable.  Breath  sounds are clear and equal.  Sternum is well healed.  Cardiac rhythm is  regular without S3 gallop or murmur.  The leg incisions are healed,  although he still has some left calf and ankle edema but no cellulitis  or significant tenderness.   DIAGNOSTIC TESTS:  A PA and lateral chest x-ray reveals clear  lung  fields.  No significant pleural effusion or pulmonary infiltrate.  The  sternal wires are intact.   IMPRESSION AND PLAN:  I told the patient he could progress in his  activity to driving and light lifting.  He knows not to lift more than  20 pounds until mid January.  I gave him a short course of oral Lasix 40  mg a day x10 days for his left leg swelling.  He can start outpatient  cardiac rehab in the near future.  No prescriptions other than the Lasix  were provided this office visit.  He will return here as needed.   Kerin Perna, M.D.  Electronically Signed   PV/MEDQ  D:  03/26/2009  T:  03/27/2009  Job:  [id number]   cc:   Luis Abed, MD, [facility]  Fara Chute

## 2010-10-13 ENCOUNTER — Other Ambulatory Visit: Payer: Self-pay | Admitting: *Deleted

## 2010-10-13 MED ORDER — METOPROLOL TARTRATE 25 MG PO TABS: 25.0000 mg | ORAL_TABLET | Freq: Two times a day (BID) | ORAL | Status: DC

## 2011-02-17 ENCOUNTER — Telehealth: Payer: Self-pay | Admitting: Cardiology

## 2011-02-17 MED ORDER — LISINOPRIL 10 MG PO TABS: 10.0000 mg | ORAL_TABLET | Freq: Every day | ORAL | Status: DC

## 2011-02-17 NOTE — Telephone Encounter (Signed)
Lisinopril 10mg , 30 day supply with refills, [facility], pt was at pharmacy, completely out of med

## 2011-05-04 IMAGING — CR DG CHEST 1V PORT
1 series · 1 of 1 positions shown · non-contrast
Comparison: 02/28/2009.

CLINICAL DATA: Coronary artery disease.  CABG.

PORTABLE CHEST - 1 VIEW

[view not recorded]
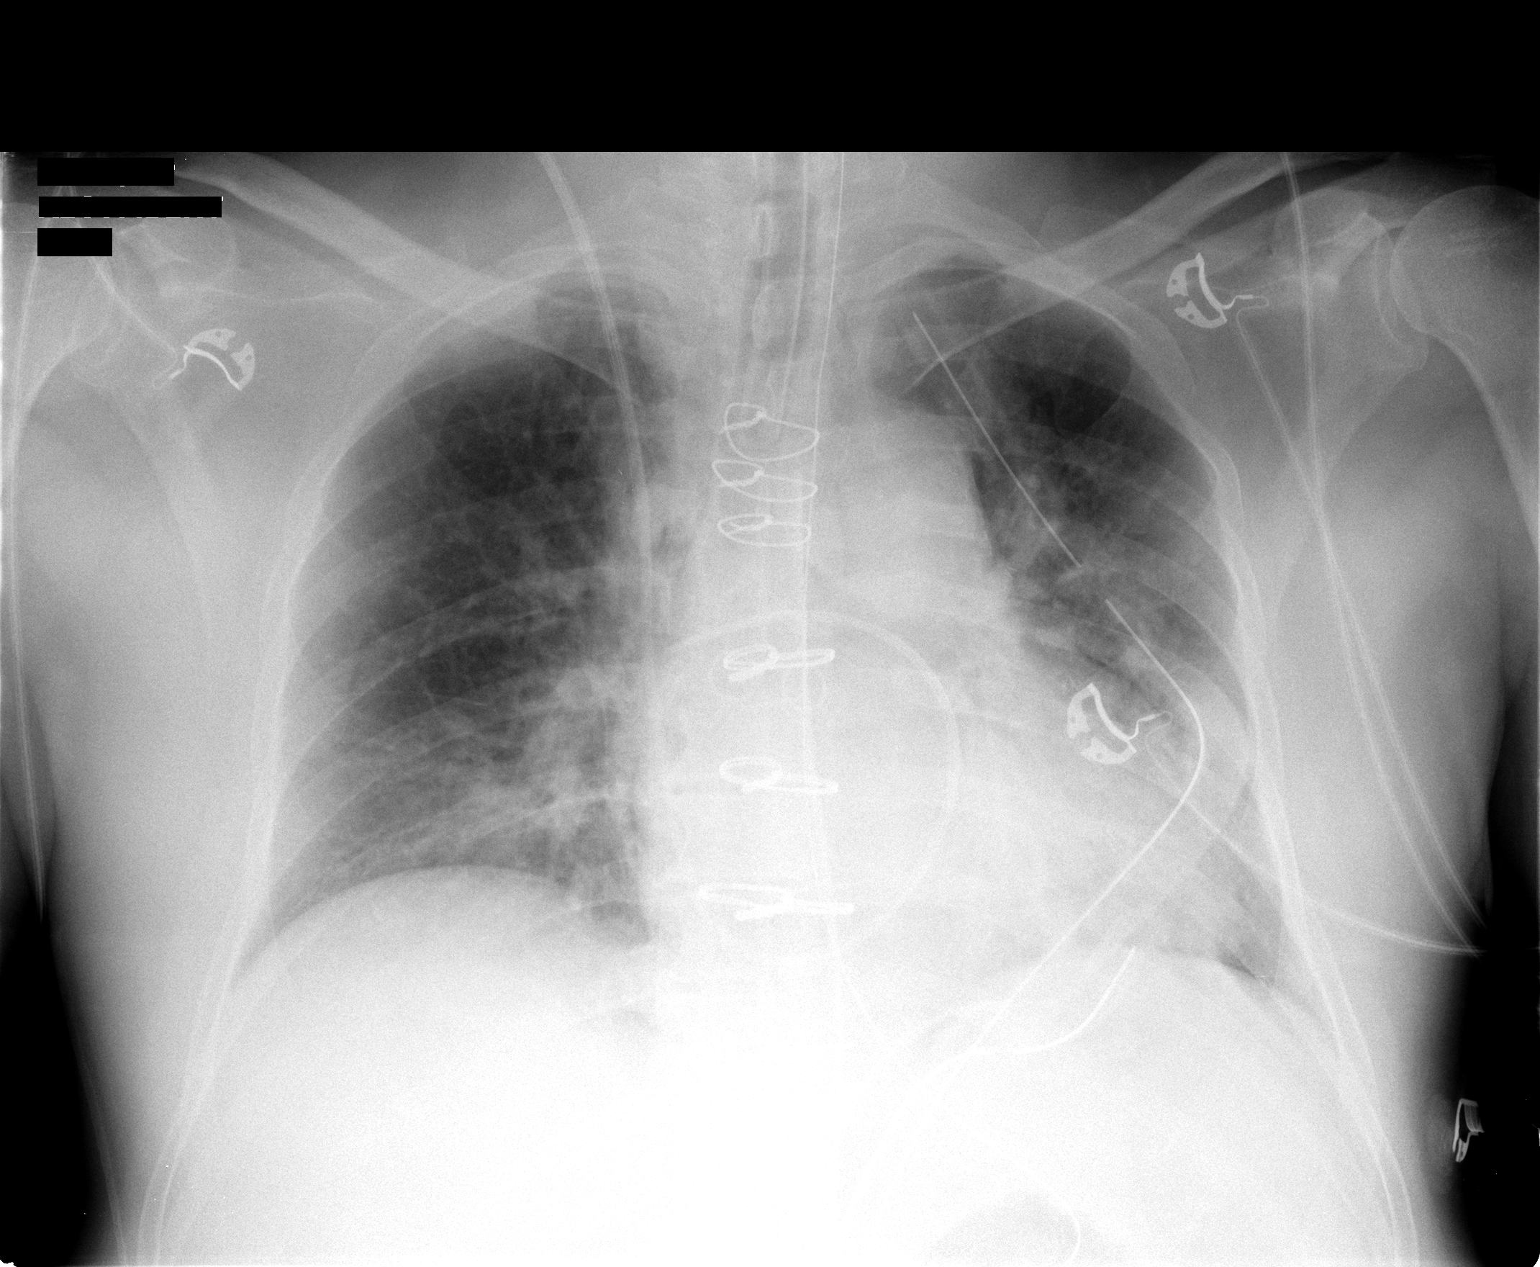

[1 of 1 positions shown; findings below may reference images not displayed]

FINDINGS: Portable AP radiograph demonstrates endotracheal tube
with the tip 4.5 cm from the carina.  Mediastinal drain and left
thoracostomy tubes are present.  Right IJ Swan-Ganz catheter is
present with the tip in the right pulmonary artery, overlying the
right hilum.  Lung volumes are low with bibasilar atelectasis.  No
definite airspace disease or edema.  Cardiopericardial silhouette
appears within normal limits for size based on volumes of
inspiration.  New median sternotomy wires and bypass graft markers
are present. Nasogastric tube is present in the esophagus with the
tip not visualized.
IMPRESSION: Expected postoperative appearance after CABG.  Support apparatus
appears in good position.

## 2011-05-05 IMAGING — CR DG CHEST 1V PORT
1 series · 1 of 1 positions shown · non-contrast
Comparison: 1 day prior

CLINICAL DATA: CABG.  Coronary artery disease.

PORTABLE CHEST - 1 VIEW

[AP]
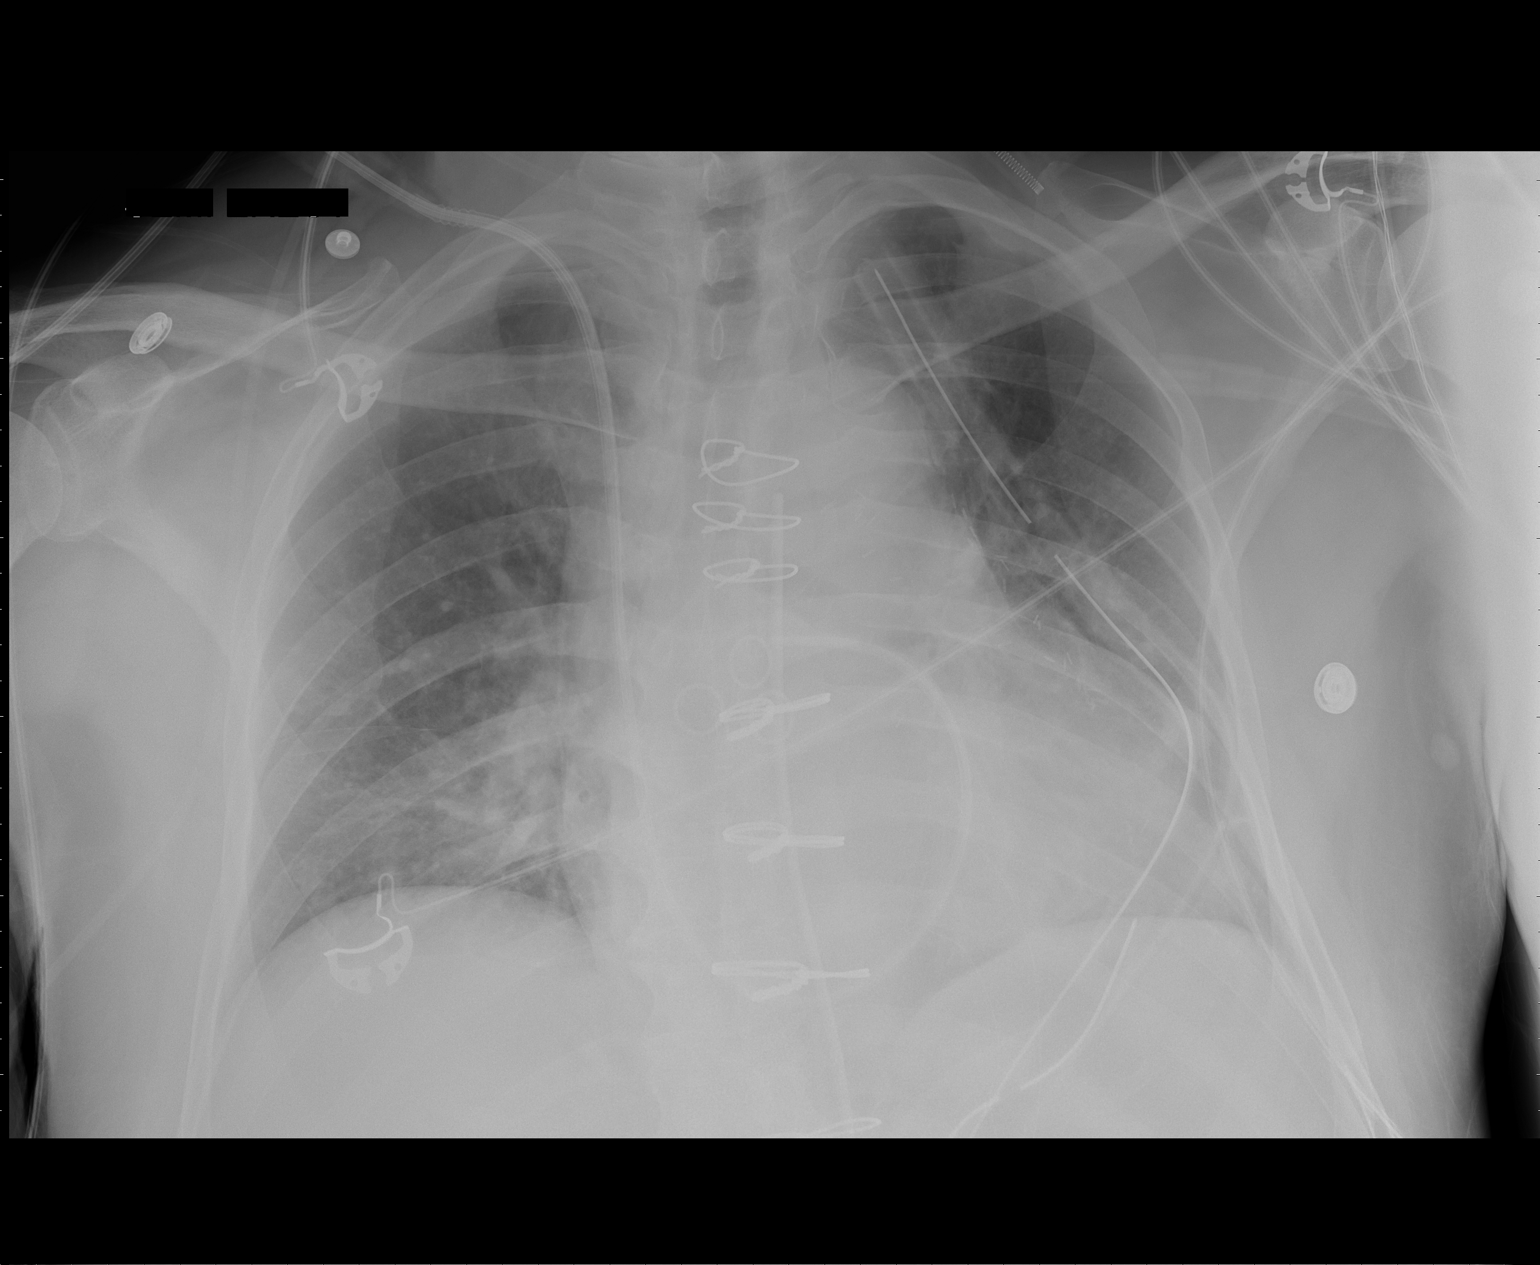

[1 of 1 positions shown; findings below may reference images not displayed]

FINDINGS: Interval extubation and removal of nasogastric tube.
Right IJ Swan-Ganz catheter.  Tip now at the more proximal right
pulmonary artery.  Mediastinal drain and 2 left-sided chest tubes
are unchanged in position.

Cardiomegaly accentuated by AP portable technique.  Low lung
volumes with probable mild pulmonary venous congestion. No pleural
effusion or pneumothorax.
IMPRESSION: 1. Similar low lung volumes with mild pulmonary venous congestion.
2.  Extubation without other significant change or pneumothorax.

## 2011-08-16 ENCOUNTER — Encounter: Payer: Self-pay | Admitting: Cardiology

## 2011-08-16 ENCOUNTER — Ambulatory Visit (INDEPENDENT_AMBULATORY_CARE_PROVIDER_SITE_OTHER): Payer: 59 | Admitting: Cardiology

## 2011-08-16 VITALS — BP 112/77 | HR 69 | Ht 65.0 in | Wt 175.0 lb

## 2011-08-16 DIAGNOSIS — I779 Disorder of arteries and arterioles, unspecified: Secondary | ICD-10-CM

## 2011-08-16 DIAGNOSIS — I2510 Atherosclerotic heart disease of native coronary artery without angina pectoris: Secondary | ICD-10-CM

## 2011-08-16 DIAGNOSIS — E8779 Other fluid overload: Secondary | ICD-10-CM

## 2011-08-16 DIAGNOSIS — R0602 Shortness of breath: Secondary | ICD-10-CM

## 2011-08-16 DIAGNOSIS — I10 Essential (primary) hypertension: Secondary | ICD-10-CM

## 2011-08-16 DIAGNOSIS — E785 Hyperlipidemia, unspecified: Secondary | ICD-10-CM

## 2011-08-16 NOTE — Assessment & Plan Note (Signed)
Coronary disease is stable. Patient underwent CABG in 2010. He's not having any significant symptoms. He is on appropriate medications. His blood pressures under good control. No further workup.

## 2011-08-16 NOTE — Assessment & Plan Note (Signed)
Blood pressure is stable. No change in therapy. 

## 2011-08-16 NOTE — Assessment & Plan Note (Signed)
Patient's lipids are being treated and followed by his primary physician.

## 2011-08-16 NOTE — Assessment & Plan Note (Signed)
Patient has carotid artery disease. The last study was done in October, 2010. He needs a Doppler in the near future. This will be scheduled. All seem for cardiology followup in one year.

## 2011-08-16 NOTE — Patient Instructions (Signed)
Your physician you to follow up in 1 year. You will receive a reminder letter in the mail one-two months in advance. If you don't receive a letter, please call our office to schedule the follow-up appointment. Your physician recommends that you continue on your current medications as directed. Please refer to the Current Medication list given to you today. Your physician has requested that you have a carotid duplex. This test is an ultrasound of the carotid arteries in your neck. It looks at blood flow through these arteries that supply the brain with blood. Allow one hour for this exam. There are no restrictions or special instructions. If the results of your test are normal or stable, you will receive a letter. If they are abnormal, the nurse will contact you by phone.  

## 2011-08-16 NOTE — Assessment & Plan Note (Signed)
He is not having any significant shortness of breath. No further workup. 

## 2011-08-16 NOTE — Assessment & Plan Note (Signed)
His volume status is stable. No change in therapy. 

## 2011-08-16 NOTE — Progress Notes (Signed)
HPI Patient is seen for cardiology followup. I saw him last April, 2012. He's doing very well. He has known coronary disease. He underwent CABG in October, 2010. He is recovered nicely. He has normal LV function in the past in October of 2010. He does have carotid artery disease. His last Doppler was done on October 2 010. He had 40-59% RICA. I reviewed the records. There has been no carotid Doppler since that time.  No Known Allergies  Current Outpatient Prescriptions  Medication Sig Dispense Refill  . acetaminophen (TYLENOL) 500 MG tablet Take 500 mg by mouth every 6 (six) hours as needed.        . Alpha-D-Galactosidase (BEANO PO) Take 1 tablet by mouth as needed.        Marland Kitchen aspirin 325 MG tablet Take 325 mg by mouth daily.        . famotidine (PEPCID) 20 MG tablet Take 20 mg by mouth as needed.        Marland Kitchen lisinopril (PRINIVIL,ZESTRIL) 10 MG tablet Take 1 tablet (10 mg total) by mouth daily.  30 tablet  7  . metoprolol tartrate (LOPRESSOR) 25 MG tablet Take 1 tablet (25 mg total) by mouth 2 (two) times daily.  60 tablet  6  . Multiple Vitamins-Minerals (MULTIVITAMIN WITH MINERALS) tablet Take 1 tablet by mouth daily.        . nitroGLYCERIN (NITROSTAT) 0.4 MG SL tablet Place 0.4 mg under the tongue every 5 (five) minutes as needed.        . simvastatin (ZOCOR) 40 MG tablet Take 40 mg by mouth at bedtime.        . traMADol (ULTRAM) 50 MG tablet Take 50-100 mg by mouth every 4 (four) hours.         History   Social History  . Marital Status: Married    Spouse Name: N/A    Number of Children: 2  . Years of Education: N/A   Occupational History  . full time     Cabin crew facility in [place]   Social History Main Topics  . Smoking status: Never Smoker   . Smokeless tobacco: Never Used  . Alcohol Use: No  . Drug Use: No  . Sexually Active: Not on file   Other Topics Concern  . Not on file   Social History Narrative  . No narrative on file    No family history on  file.  Past Medical History  Diagnosis Date  . CAD (coronary artery disease)   . Normal echocardiogram     EF 65%..echo...03/01/2009  . Carotid artery disease     doppler...03/01/2009.Marland KitchenMarland Kitchen40-59%RICA  . Fatigue   . Tiredness   . Hypertension     well controlled per Dr.Sasser notes  . Skin rash     left leg and left anterior chest  . Left shoulder pain     october 2011.Marland KitchenMarland Kitchenprobably orthopedic  . Fluid overload   . Hx of CABG     October, 2010  . Erectile dysfunction     Past Surgical History  Procedure Date  . Coronary artery bypass graft     ...x4....03/02/2009....Dr.Van Tright    ROS   Patient denies fever, chills, headache, sweats, rash, change in vision, change in hearing, chest pain, cough, nausea or vomiting, urinary symptoms. All other systems are reviewed and are negative.  PHYSICAL EXAM  Patient looks very good today. There is no jugulovenous distention. Lungs are clear. Respiratory effort is nonlabored. Cardiac exam reveals S1  and S2. There is a 2/6 systolic murmur. The abdomen is soft. There is no peripheral edema. There are no musculoskeletal deformities. There are no skin rashes.  Filed Vitals:   08/16/11 1331  BP: 112/77  Pulse: 69  Height: 5\' 5"  (1.651 m)  Weight: 175 lb (79.379 kg)   EKG is done today and reviewed by me. There is normal sinus rhythm. There is no significant abnormality. There is no significant change.  ASSESSMENT & PLAN

## 2011-08-24 ENCOUNTER — Encounter (INDEPENDENT_AMBULATORY_CARE_PROVIDER_SITE_OTHER): Payer: 59

## 2011-08-24 DIAGNOSIS — I6529 Occlusion and stenosis of unspecified carotid artery: Secondary | ICD-10-CM

## 2011-08-24 DIAGNOSIS — I779 Disorder of arteries and arterioles, unspecified: Secondary | ICD-10-CM

## 2011-08-28 ENCOUNTER — Encounter: Payer: Self-pay | Admitting: Cardiology

## 2011-08-29 ENCOUNTER — Encounter: Payer: Self-pay | Admitting: *Deleted

## 2011-10-17 ENCOUNTER — Other Ambulatory Visit: Payer: Self-pay | Admitting: Cardiology

## 2011-10-17 NOTE — Telephone Encounter (Signed)
Fax Received. Refill Completed. Cristabel Bicknell Chowoe (R.M.A)   

## 2012-08-21 ENCOUNTER — Ambulatory Visit (INDEPENDENT_AMBULATORY_CARE_PROVIDER_SITE_OTHER): Payer: Self-pay | Admitting: Cardiology

## 2012-08-21 ENCOUNTER — Encounter: Payer: Self-pay | Admitting: Cardiology

## 2012-08-21 VITALS — BP 136/89 | HR 67 | Ht 65.0 in | Wt 179.0 lb

## 2012-08-21 DIAGNOSIS — I2510 Atherosclerotic heart disease of native coronary artery without angina pectoris: Secondary | ICD-10-CM

## 2012-08-21 DIAGNOSIS — I10 Essential (primary) hypertension: Secondary | ICD-10-CM

## 2012-08-21 DIAGNOSIS — E785 Hyperlipidemia, unspecified: Secondary | ICD-10-CM

## 2012-08-21 NOTE — Assessment & Plan Note (Signed)
Coronary disease is stable. No change in therapy. 

## 2012-08-21 NOTE — Assessment & Plan Note (Signed)
Blood pressure is controlled. No change in therapy. 

## 2012-08-21 NOTE — Patient Instructions (Addendum)

## 2012-08-21 NOTE — Assessment & Plan Note (Signed)
His lipids are being treated. No change in therapy.   

## 2012-08-21 NOTE — Progress Notes (Signed)
HPI  The patient is seen to followup coronary disease. He underwent CABG October, 2010. I saw him last April, 2013. He has carotid disease. He had a followup Doppler in April, 2013. This was stable. He's not having any chest pain or shortness of breath.  No Known Allergies  Current Outpatient Prescriptions  Medication Sig Dispense Refill  . acetaminophen (TYLENOL) 500 MG tablet Take 500 mg by mouth every 6 (six) hours as needed.        . Alpha-D-Galactosidase (BEANO PO) Take 1 tablet by mouth as needed.        Marland Kitchen aspirin 325 MG tablet Take 325 mg by mouth daily.        . famotidine (PEPCID) 20 MG tablet Take 20 mg by mouth as needed.        Marland Kitchen lisinopril (PRINIVIL,ZESTRIL) 2.5 MG tablet Take 2.5 mg by mouth daily.      . metoprolol tartrate (LOPRESSOR) 25 MG tablet Take 1 tablet (25 mg total) by mouth 2 (two) times daily.  60 tablet  6  . Multiple Vitamins-Minerals (MULTIVITAMIN WITH MINERALS) tablet Take 1 tablet by mouth daily.        . nitroGLYCERIN (NITROSTAT) 0.4 MG SL tablet Place 0.4 mg under the tongue every 5 (five) minutes as needed.        . simvastatin (ZOCOR) 40 MG tablet Take 40 mg by mouth at bedtime.        . traMADol (ULTRAM) 50 MG tablet Take 50-100 mg by mouth every 4 (four) hours.        No current facility-administered medications for this visit.    History   Social History  . Marital Status: Married    Spouse Name: N/A    Number of Children: 2  . Years of Education: N/A   Occupational History  . full time     Cabin crew facility in [place]   Social History Main Topics  . Smoking status: Never Smoker   . Smokeless tobacco: Never Used  . Alcohol Use: No  . Drug Use: No  . Sexually Active: Not on file   Other Topics Concern  . Not on file   Social History Narrative  . No narrative on file    History reviewed. No pertinent family history.  Past Medical History  Diagnosis Date  . CAD (coronary artery disease)   . Normal  echocardiogram     EF 65%..echo...03/01/2009  . Carotid artery disease     doppler...03/01/2009.Marland KitchenMarland Kitchen40-59%RICA  . Fatigue   . Tiredness   . Hypertension     well controlled per Dr.Sasser notes  . Skin rash     left leg and left anterior chest  . Left shoulder pain     october 2011.Marland KitchenMarland Kitchenprobably orthopedic  . Fluid overload   . Hx of CABG     October, 2010  . Erectile dysfunction     Past Surgical History  Procedure Laterality Date  . Coronary artery bypass graft      ...x4....03/02/2009....Dr.Van Tright    Patient Active Problem List  Diagnosis  . DYSLIPIDEMIA  . CAD  . FATIGUE  . SHORTNESS OF BREATH  . Normal echocardiogram  . Carotid artery disease  . Hypertension  . Fluid overload  . Hx of CABG  . Erectile dysfunction    ROS   Patient denies fever, chills, headache, sweats, rash, change in vision, change in hearing, chest pain, cough, nausea vomiting, urinary symptoms. All other systems are reviewed and are negative.  PHYSICAL EXAM  Patient is oriented to person time and place. Affect is normal. There is no jugulovenous distention. Lungs are clear. Respiratory effort is nonlabored. Cardiac exam reveals an S1 and S2. There no clicks or significant murmurs. The abdomen is soft. There is no peripheral edema.  Filed Vitals:   08/21/12 1316  BP: 136/89  Pulse: 67  Height: 5\' 5"  (1.651 m)  Weight: 179 lb (81.194 kg)   EKG is done today and reviewed by me. There is no significant change.  ASSESSMENT & PLAN

## 2013-09-02 ENCOUNTER — Encounter: Payer: Self-pay | Admitting: Cardiology

## 2013-09-02 DIAGNOSIS — R9430 Abnormal result of cardiovascular function study, unspecified: Secondary | ICD-10-CM | POA: Insufficient documentation

## 2013-09-03 ENCOUNTER — Ambulatory Visit (INDEPENDENT_AMBULATORY_CARE_PROVIDER_SITE_OTHER): Payer: Medicare PPO | Admitting: Cardiology

## 2013-09-03 ENCOUNTER — Encounter: Payer: Self-pay | Admitting: Cardiology

## 2013-09-03 VITALS — BP 145/84 | HR 61 | Ht 65.0 in | Wt 184.0 lb

## 2013-09-03 DIAGNOSIS — I779 Disorder of arteries and arterioles, unspecified: Secondary | ICD-10-CM

## 2013-09-03 DIAGNOSIS — I739 Peripheral vascular disease, unspecified: Secondary | ICD-10-CM

## 2013-09-03 DIAGNOSIS — I2510 Atherosclerotic heart disease of native coronary artery without angina pectoris: Secondary | ICD-10-CM

## 2013-09-03 DIAGNOSIS — E785 Hyperlipidemia, unspecified: Secondary | ICD-10-CM

## 2013-09-03 DIAGNOSIS — I10 Essential (primary) hypertension: Secondary | ICD-10-CM

## 2013-09-03 MED ORDER — ASPIRIN EC 81 MG PO TBEC: 81.0000 mg | DELAYED_RELEASE_TABLET | Freq: Every day | ORAL | Status: DC

## 2013-09-03 NOTE — Assessment & Plan Note (Signed)
His last Doppler was 2013. He does have moderate disease. He needs a followup Doppler.

## 2013-09-03 NOTE — Patient Instructions (Signed)
Your physician recommends that you schedule a follow-up appointment in: 1 year. You will receive a reminder letter in the mail in about 10 months reminding you to call and schedule your appointment. If you don't receive this letter, please contact our office. Your physician has recommended you make the following change in your medication:  Decrease your aspirin to 81 mg daily. All other medications will remain the same. Your physician has requested that you have a carotid duplex. This test is an ultrasound of the carotid arteries in your neck. It looks at blood flow through these arteries that supply the brain with blood. Allow one hour for this exam. There are no restrictions or special instructions.

## 2013-09-03 NOTE — Progress Notes (Signed)
Patient ID: Kyle Boyd, male   DOB: 08/19/1950, 63 y.o.   MRN: [id number]    HPI  Patient is seen for cardiology followup. He has known coronary disease. He underwent CABG in 2010. I saw him last a year ago and he is been quite stable. He does have carotid disease. His last Doppler was in 2013. He needs a followup Doppler. He's not having any chest pain.  No Known Allergies  Current Outpatient Prescriptions  Medication Sig Dispense Refill  . acetaminophen (TYLENOL) 500 MG tablet Take 500 mg by mouth every 6 (six) hours as needed.        . Alpha-D-Galactosidase (BEANO PO) Take 1 tablet by mouth as needed.        Marland Kitchen aspirin 325 MG tablet Take 325 mg by mouth daily.        . famotidine (PEPCID) 20 MG tablet Take 20 mg by mouth as needed.        Marland Kitchen lisinopril (PRINIVIL,ZESTRIL) 2.5 MG tablet Take 2.5 mg by mouth daily.      . metoprolol tartrate (LOPRESSOR) 25 MG tablet Take 1 tablet (25 mg total) by mouth 2 (two) times daily.  60 tablet  6  . Multiple Vitamins-Minerals (MULTIVITAMIN WITH MINERALS) tablet Take 1 tablet by mouth daily.        . nitroGLYCERIN (NITROSTAT) 0.4 MG SL tablet Place 0.4 mg under the tongue every 5 (five) minutes as needed.        . simvastatin (ZOCOR) 40 MG tablet Take 40 mg by mouth at bedtime.        . traMADol (ULTRAM) 50 MG tablet Take 50-100 mg by mouth every 4 (four) hours.        No current facility-administered medications for this visit.    History   Social History  . Marital Status: Married    Spouse Name: N/A    Number of Children: 2  . Years of Education: N/A   Occupational History  . 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 Smoking status: Never Smoker   . Smokeless tobacco: Never Used  . Alcohol Use: No  . Drug Use: No  . Sexual Activity: Not on file   Other Topics Concern  . Not on file   Social History Narrative  . No narrative on file    History reviewed. No pertinent family  history.  Past Medical History  Diagnosis Date  . CAD (coronary artery disease)   . Normal echocardiogram     EF 65%..echo...03/01/2009  . Carotid artery disease     doppler...03/01/2009.Marland KitchenMarland Kitchen40-59%RICA  . Fatigue   . Tiredness   . Hypertension     well controlled per Dr.Sasser notes  . Skin rash     left leg and left anterior chest  . Left shoulder pain     october 2011.Marland KitchenMarland Kitchenprobably orthopedic  . Fluid overload   . Hx of CABG     October, 2010  . Erectile dysfunction   . Ejection fraction     Past Surgical History  Procedure Laterality Date  . Coronary artery bypass graft      ...x4....03/02/2009....Dr.Van Tright    Patient Active Problem List   Diagnosis Date Noted  . Ejection fraction   . Normal echocardiogram   . Carotid artery disease   . Hypertension   . Fluid overload   . Hx of CABG   . Erectile dysfunction   . CAD 04/09/2009  .  DYSLIPIDEMIA 02/23/2009  . FATIGUE 02/23/2009  . SHORTNESS OF BREATH 02/23/2009    ROS   Patient denies fever, chills, headache, sweats, rash, change in vision, change in hearing, chest pain, cough, nausea vomiting, urinary symptoms. All other systems are reviewed and are negative.  PHYSICAL EXAM  Patient is oriented to person time and place. Affect is normal. He is overweight. Head is atraumatic. Sclera and conjunctiva are normal. There is no jugulovenous distention. Lungs are clear. Respiratory effort is nonlabored. Cardiac exam reveals S1 and S2. There no clicks or significant murmurs. The abdomen is soft. There is no peripheral edema. There no musculoskeletal deformities. There are no skin rashes.  Filed Vitals:   09/03/13 0821  BP: 145/84  Pulse: 61  Height: 5\' 5"  (1.651 m)  Weight: 184 lb (83.462 kg)  SpO2: 95%   EKG is done today and reviewed by me. There is slight diffuse J-point elevation. There is no significant change.  ASSESSMENT & PLAN

## 2013-09-03 NOTE — Assessment & Plan Note (Signed)
The patient's lipids are being treated with a statin. He is not on full dose guideline corrected therapy. However I feel this is optimal dosing for this patient at this time. No change in therapy.

## 2013-09-03 NOTE — Assessment & Plan Note (Addendum)
Coronary disease is stable. No change in therapy. His aspirin can be reduced 81 mg.

## 2013-09-03 NOTE — Assessment & Plan Note (Signed)
Blood pressure is controlled. No change in therapy. 

## 2013-09-04 ENCOUNTER — Encounter (INDEPENDENT_AMBULATORY_CARE_PROVIDER_SITE_OTHER): Payer: Medicare PPO

## 2013-09-04 DIAGNOSIS — I6529 Occlusion and stenosis of unspecified carotid artery: Secondary | ICD-10-CM

## 2013-09-04 DIAGNOSIS — I739 Peripheral vascular disease, unspecified: Principal | ICD-10-CM

## 2013-09-04 DIAGNOSIS — I779 Disorder of arteries and arterioles, unspecified: Secondary | ICD-10-CM

## 2013-09-08 ENCOUNTER — Encounter: Payer: Self-pay | Admitting: Cardiology

## 2013-09-11 ENCOUNTER — Telehealth: Payer: Self-pay | Admitting: *Deleted

## 2013-09-11 NOTE — Telephone Encounter (Signed)
Patient informed. 

## 2013-09-11 NOTE — Telephone Encounter (Signed)
Message copied by Merlene Laughter on Thu Sep 11, 2013  1:53 PM ------      Message from: Ron Parker, [place] D      Created: Mon Sep 08, 2013  7:44 PM       Please let the patient know that there has been mild increase in the narrowing of his left carotid. This is still only moderate. It is safe to continue to follow this up in one year ------

## 2014-09-07 ENCOUNTER — Ambulatory Visit: Payer: Medicare PPO | Admitting: Cardiology

## 2014-09-16 ENCOUNTER — Encounter: Payer: Self-pay | Admitting: *Deleted

## 2014-09-18 ENCOUNTER — Encounter: Payer: Self-pay | Admitting: Cardiology

## 2014-09-18 ENCOUNTER — Ambulatory Visit (INDEPENDENT_AMBULATORY_CARE_PROVIDER_SITE_OTHER): Payer: Medicare FFS | Admitting: Cardiology

## 2014-09-18 VITALS — BP 120/75 | HR 53 | Ht 65.0 in | Wt 178.8 lb

## 2014-09-18 DIAGNOSIS — I10 Essential (primary) hypertension: Secondary | ICD-10-CM

## 2014-09-18 DIAGNOSIS — E785 Hyperlipidemia, unspecified: Secondary | ICD-10-CM

## 2014-09-18 DIAGNOSIS — I739 Peripheral vascular disease, unspecified: Secondary | ICD-10-CM

## 2014-09-18 DIAGNOSIS — I779 Disorder of arteries and arterioles, unspecified: Secondary | ICD-10-CM

## 2014-09-18 DIAGNOSIS — I2510 Atherosclerotic heart disease of native coronary artery without angina pectoris: Secondary | ICD-10-CM | POA: Diagnosis not present

## 2014-09-18 NOTE — Patient Instructions (Addendum)
Your physician recommends that you continue on your current medications as directed. Please refer to the Current Medication list given to you today. Your physician has requested that you have a carotid duplex. This test is an ultrasound of the carotid arteries in your neck. It looks at blood flow through these arteries that supply the brain with blood. Allow one hour for this exam. There are no restrictions or special instructions. Your physician recommends that you schedule a follow-up appointment in: 1 year. You will receive a reminder letter in the mail in about 10 months reminding you to call and schedule your appointment. If you don't receive this letter, please contact our office. 

## 2014-09-18 NOTE — Assessment & Plan Note (Signed)
Guidelines would recommend that his simvastatin 40 mg daily be changed to atorvastatin 40 mg daily. I'm hesitant to make this change based purely on guidelines. His lipids are followed by [name]. If his LDL is 80 or lower, it seems appropriate to continue his current medication. If his LDL is above 80, consideration can be given to treating him with a more potent statin.

## 2014-09-18 NOTE — Progress Notes (Signed)
Cardiology Office Note   Date:  09/18/2014   ID:  Kyle Boyd, DOB 04/24/1951, MRN [id number]  PCP:  Manon Hilding, MD  Cardiologist:  Dola Argyle, MD   Chief Complaint  Patient presents with  . Appointment    Follow-up coronary artery disease      History of Present Illness: Kyle Boyd is a 64 y.o. male who presents to follow-up coronary artery disease. He is doing very well. He underwent CABG 2010. He is not having any symptoms. We are following his carotids carefully. His last Doppler in April, 2015 revealed 40-59% R ICA. There was some progression in the LICA to 93-79%. A one-year follow-up was recommended and we will arrange this at this time.    Past Medical History  Diagnosis Date  . CAD (coronary artery disease)   . Normal echocardiogram     EF 65%..echo...03/01/2009  . Carotid artery disease     doppler...03/01/2009.Marland KitchenMarland Kitchen40-59%RICA  . Fatigue   . Tiredness   . Hypertension     well controlled per Dr.Sasser notes  . Skin rash     left leg and left anterior chest  . Left shoulder pain     october 2011.Marland KitchenMarland Kitchenprobably orthopedic  . Fluid overload   . Hx of CABG     October, 2010  . Erectile dysfunction   . Ejection fraction     Past Surgical History  Procedure Laterality Date  . Coronary artery bypass graft      ...x4....03/02/2009....Dr.Van Tright    Patient Active Problem List   Diagnosis Date Noted  . Ejection fraction   . Normal echocardiogram   . Carotid artery disease   . Hypertension   . Fluid overload   . Hx of CABG   . Erectile dysfunction   . CAD (coronary artery disease) 04/09/2009  . DYSLIPIDEMIA 02/23/2009  . FATIGUE 02/23/2009  . SHORTNESS OF BREATH 02/23/2009      Current Outpatient Prescriptions  Medication Sig Dispense Refill  . acetaminophen (TYLENOL) 500 MG tablet Take 500 mg by mouth every 6 (six) hours as needed.      . Alpha-D-Galactosidase (BEANO PO) Take 1 tablet by mouth as needed.      Marland Kitchen aspirin EC 81 MG tablet Take 1  tablet (81 mg total) by mouth daily. 90 tablet 3  . famotidine (PEPCID) 20 MG tablet Take 20 mg by mouth as needed.      Marland Kitchen lisinopril (PRINIVIL,ZESTRIL) 5 MG tablet Take 5 mg by mouth daily.     . metoprolol tartrate (LOPRESSOR) 25 MG tablet Take 1 tablet (25 mg total) by mouth 2 (two) times daily. 60 tablet 6  . Multiple Vitamins-Minerals (MULTIVITAMIN WITH MINERALS) tablet Take 1 tablet by mouth daily.      . nitroGLYCERIN (NITROSTAT) 0.4 MG SL tablet Place 0.4 mg under the tongue every 5 (five) minutes as needed.      . simvastatin (ZOCOR) 40 MG tablet Take 40 mg by mouth at bedtime.      . traMADol (ULTRAM) 50 MG tablet Take 50-100 mg by mouth every 4 (four) hours.      No current facility-administered medications for this visit.    Allergies:   Review of patient's allergies indicates no known allergies.    Social History:  The patient  reports that he has never smoked. He has never used smokeless tobacco. He reports that he does not drink alcohol or use illicit drugs.   Family History:  The patient's family history includes Heart disease  in his mother.    ROS:  Please see the history of present illness.   Patient denies fever, chills, headache, sweats, rash, change in vision, change in hearing, chest pain, cough, nausea or vomiting, urinary symptoms. All other systems are reviewed and are negative.     PHYSICAL EXAM: VS:  BP 120/75 mmHg  Pulse 53  Ht 5\' 5"  (1.651 m)  Wt 178 lb 12.8 oz (81.103 kg)  BMI 29.75 kg/m2  SpO2 95% , Patient is oriented to person time and place. Affect is normal. Head is atraumatic. Sclera and conjunctiva are normal. There is no jugular venous distention. Lungs are clear. Respiratory effort is nonlabored. Cardiac exam reveals S1 and S2. The abdomen is soft. There is no peripheral edema. There are no musculoskeletal deformities. There are no skin rashes.  EKG:   EKG is done today and reviewed by me. There is mild sinus bradycardia. The QRS is normal. There  is no change from the past.   Recent Labs: No results found for requested labs within last 365 days.    Lipid Panel    Component Value Date/Time   CHOL  02/27/2009 0515    158        ATP III CLASSIFICATION:  <200     mg/dL   Desirable  200-239  mg/dL   Borderline High  >=240    mg/dL   High          TRIG 141 02/27/2009 0515   HDL 27* 02/27/2009 0515   CHOLHDL 5.9 02/27/2009 0515   VLDL 28 02/27/2009 0515   LDLCALC * 02/27/2009 0515    103        Total Cholesterol/HDL:CHD Risk Coronary Heart Disease Risk Table                     Men   Women  1/2 Average Risk   3.4   3.3  Average Risk       5.0   4.4  2 X Average Risk   9.6   7.1  3 X Average Risk  23.4   11.0        Use the calculated Patient Ratio above and the CHD Risk Table to determine the patient's CHD Risk.        ATP III CLASSIFICATION (LDL):  <100     mg/dL   Optimal  100-129  mg/dL   Near or Above                    Optimal  130-159  mg/dL   Borderline  160-189  mg/dL   High  >190     mg/dL   Very High      Wt Readings from Last 3 Encounters:  09/18/14 178 lb 12.8 oz (81.103 kg)  09/03/13 184 lb (83.462 kg)  08/21/12 179 lb (81.194 kg)      Current medicines are reviewed  The patient understands his medications.     ASSESSMENT AND PLAN:

## 2014-09-18 NOTE — Assessment & Plan Note (Signed)
Blood pressures controlled. No change in therapy. 

## 2014-09-18 NOTE — Assessment & Plan Note (Signed)
There was slight progression in his carotid disease last year. We will arrange for a one-year follow-up soon.

## 2014-09-18 NOTE — Assessment & Plan Note (Signed)
Coronary disease is stable. He and I discussed whether we should do exercise testing. He is having absolutely no symptoms. We have decided not to do any testing this year.

## 2014-09-24 ENCOUNTER — Ambulatory Visit (INDEPENDENT_AMBULATORY_CARE_PROVIDER_SITE_OTHER): Payer: Medicare FFS

## 2014-09-24 DIAGNOSIS — I779 Disorder of arteries and arterioles, unspecified: Secondary | ICD-10-CM

## 2014-09-24 DIAGNOSIS — I739 Peripheral vascular disease, unspecified: Principal | ICD-10-CM

## 2014-09-29 ENCOUNTER — Telehealth: Payer: Self-pay | Admitting: *Deleted

## 2014-09-29 NOTE — Telephone Encounter (Signed)
-----   Message from Carlena Bjornstad, MD sent at 09/28/2014  4:42 PM EDT ----- Please notify the patient at his carotid Dopplers stable. This is good.

## 2014-09-29 NOTE — Telephone Encounter (Signed)
Patient informed. 

## 2015-02-09 DIAGNOSIS — E8881 Metabolic syndrome: Secondary | ICD-10-CM | POA: Diagnosis not present

## 2015-02-09 DIAGNOSIS — R739 Hyperglycemia, unspecified: Secondary | ICD-10-CM | POA: Diagnosis not present

## 2015-02-09 DIAGNOSIS — Z86010 Personal history of colonic polyps: Secondary | ICD-10-CM | POA: Diagnosis not present

## 2015-02-09 DIAGNOSIS — Z23 Encounter for immunization: Secondary | ICD-10-CM | POA: Diagnosis not present

## 2015-02-09 DIAGNOSIS — K7581 Nonalcoholic steatohepatitis (NASH): Secondary | ICD-10-CM | POA: Diagnosis not present

## 2015-02-09 DIAGNOSIS — I10 Essential (primary) hypertension: Secondary | ICD-10-CM | POA: Diagnosis not present

## 2015-02-09 DIAGNOSIS — E782 Mixed hyperlipidemia: Secondary | ICD-10-CM | POA: Diagnosis not present

## 2015-02-09 DIAGNOSIS — E781 Pure hyperglyceridemia: Secondary | ICD-10-CM | POA: Diagnosis not present

## 2015-02-09 DIAGNOSIS — R748 Abnormal levels of other serum enzymes: Secondary | ICD-10-CM | POA: Diagnosis not present

## 2015-04-26 DIAGNOSIS — R748 Abnormal levels of other serum enzymes: Secondary | ICD-10-CM | POA: Diagnosis not present

## 2015-05-19 DIAGNOSIS — I10 Essential (primary) hypertension: Secondary | ICD-10-CM | POA: Diagnosis not present

## 2015-05-19 DIAGNOSIS — E782 Mixed hyperlipidemia: Secondary | ICD-10-CM | POA: Diagnosis not present

## 2015-05-19 DIAGNOSIS — I2510 Atherosclerotic heart disease of native coronary artery without angina pectoris: Secondary | ICD-10-CM | POA: Diagnosis not present

## 2015-08-05 DIAGNOSIS — R739 Hyperglycemia, unspecified: Secondary | ICD-10-CM | POA: Diagnosis not present

## 2015-08-05 DIAGNOSIS — E782 Mixed hyperlipidemia: Secondary | ICD-10-CM | POA: Diagnosis not present

## 2015-08-05 DIAGNOSIS — I10 Essential (primary) hypertension: Secondary | ICD-10-CM | POA: Diagnosis not present

## 2015-08-05 DIAGNOSIS — K7581 Nonalcoholic steatohepatitis (NASH): Secondary | ICD-10-CM | POA: Diagnosis not present

## 2015-08-12 DIAGNOSIS — R739 Hyperglycemia, unspecified: Secondary | ICD-10-CM | POA: Diagnosis not present

## 2015-08-12 DIAGNOSIS — Z1389 Encounter for screening for other disorder: Secondary | ICD-10-CM | POA: Diagnosis not present

## 2015-08-12 DIAGNOSIS — E781 Pure hyperglyceridemia: Secondary | ICD-10-CM | POA: Diagnosis not present

## 2015-08-12 DIAGNOSIS — E8881 Metabolic syndrome: Secondary | ICD-10-CM | POA: Diagnosis not present

## 2015-08-12 DIAGNOSIS — Z86010 Personal history of colonic polyps: Secondary | ICD-10-CM | POA: Diagnosis not present

## 2015-08-12 DIAGNOSIS — E782 Mixed hyperlipidemia: Secondary | ICD-10-CM | POA: Diagnosis not present

## 2015-08-12 DIAGNOSIS — K7581 Nonalcoholic steatohepatitis (NASH): Secondary | ICD-10-CM | POA: Diagnosis not present

## 2015-08-12 DIAGNOSIS — I10 Essential (primary) hypertension: Secondary | ICD-10-CM | POA: Diagnosis not present

## 2015-09-21 ENCOUNTER — Encounter: Payer: Self-pay | Admitting: *Deleted

## 2015-09-21 ENCOUNTER — Ambulatory Visit (INDEPENDENT_AMBULATORY_CARE_PROVIDER_SITE_OTHER): Payer: Medicare HMO | Admitting: Cardiology

## 2015-09-21 ENCOUNTER — Encounter: Payer: Self-pay | Admitting: Cardiology

## 2015-09-21 VITALS — BP 118/72 | HR 66 | Ht 65.0 in | Wt 174.0 lb

## 2015-09-21 DIAGNOSIS — I2510 Atherosclerotic heart disease of native coronary artery without angina pectoris: Secondary | ICD-10-CM | POA: Diagnosis not present

## 2015-09-21 DIAGNOSIS — E785 Hyperlipidemia, unspecified: Secondary | ICD-10-CM | POA: Diagnosis not present

## 2015-09-21 DIAGNOSIS — I739 Peripheral vascular disease, unspecified: Secondary | ICD-10-CM

## 2015-09-21 DIAGNOSIS — I10 Essential (primary) hypertension: Secondary | ICD-10-CM | POA: Diagnosis not present

## 2015-09-21 DIAGNOSIS — I779 Disorder of arteries and arterioles, unspecified: Secondary | ICD-10-CM | POA: Diagnosis not present

## 2015-09-21 MED ORDER — NITROGLYCERIN 0.4 MG SL SUBL: 0.4000 mg | SUBLINGUAL_TABLET | SUBLINGUAL | Status: DC | PRN

## 2015-09-21 NOTE — Patient Instructions (Signed)
Your physician recommends that you continue on your current medications as directed. Please refer to the Current Medication list given to you today. Your physician has requested that you have a carotid duplex. This test is an ultrasound of the carotid arteries in your neck. It looks at blood flow through these arteries that supply the brain with blood. Allow one hour for this exam. There are no restrictions or special instructions. Your physician recommends that you schedule a follow-up appointment in: 1 year. You will receive a reminder letter in the mail in about 10 months reminding you to call and schedule your appointment. If you don't receive this letter, please contact our office. 

## 2015-09-21 NOTE — Progress Notes (Signed)
Cardiology Office Note  Date: 09/21/2015   ID: Lora Knetter, DOB [birth year], MRN [id number]  PCP: Manon Hilding, MD  Evaluating Cardiologist: Rozann Lesches, MD   Chief Complaint  Patient presents with  . Coronary Artery Disease    History of Present Illness: Kyle Boyd is a 65 y.o. male former patient of [name], now establishing follow-up with. This is our first meeting in clinic. He last saw [name] in May 2016. I reviewed his records and updated the chart.  He presents for a routine visit, denies any significant angina symptoms or nitroglycerin use. He has an old bottle and needs a refill. He tells me that he walks 4 miles most days of the week at a local track. He reports NYHA class II dyspnea. No palpitations or syncope.  He has not undergone interval ischemic testing since 2010. We discussed this today but decided to hold off in light of his current functional capacity without symptoms. I did review his ECG which shows sinus rhythm with rightward axis, equivocal inferior Q waves.  He follows with [name], I am requesting his most recent lab work for review.  Last carotid Dopplers are outlined below.  Past Medical History  Diagnosis Date  . CAD (coronary artery disease)     Multivessel status post CABG 2010  . Carotid artery disease ([place])   . Essential hypertension   . Erectile dysfunction     Past Surgical History  Procedure Laterality Date  . Coronary artery bypass graft  October 2010    Dr. [name] - LIMA to LAD, SVG to diagonal, SVG to OM, SVG to RCA    Current Outpatient Prescriptions  Medication Sig Dispense Refill  . acetaminophen (TYLENOL) 500 MG tablet Take 500 mg by mouth every 6 (six) hours as needed.      . Alpha-D-Galactosidase (BEANO PO) Take 1 tablet by mouth as needed.      Marland Kitchen aspirin EC 81 MG tablet Take 1 tablet (81 mg total) by mouth daily. 90 tablet 3  . famotidine (PEPCID) 20 MG tablet Take 20 mg by mouth as needed.      Marland Kitchen  lisinopril (PRINIVIL,ZESTRIL) 5 MG tablet Take 5 mg by mouth daily.     . metoprolol tartrate (LOPRESSOR) 25 MG tablet Take 1 tablet (25 mg total) by mouth 2 (two) times daily. 60 tablet 6  . Multiple Vitamins-Minerals (MULTIVITAMIN WITH MINERALS) tablet Take 1 tablet by mouth daily.      . nitroGLYCERIN (NITROSTAT) 0.4 MG SL tablet Place 0.4 mg under the tongue every 5 (five) minutes as needed.      . simvastatin (ZOCOR) 40 MG tablet Take 40 mg by mouth at bedtime.       No current facility-administered medications for this visit.   Allergies:  Review of patient's allergies indicates no known allergies.   Social History: The patient  reports that he has never smoked. He has never used smokeless tobacco. He reports that he does not drink alcohol or use illicit drugs.   Family History: The patient's family history includes Heart disease in his mother.   ROS:  Please see the history of present illness. Otherwise, complete review of systems is positive for seasonal allergies.  All other systems are reviewed and negative.   Physical Exam: VS:  BP 118/72 mmHg  Pulse 66  Ht 5\' 5"  (1.651 m)  Wt 174 lb (78.926 kg)  BMI 28.96 kg/m2  SpO2 93%, BMI Body mass index is 28.96  kg/(m^2).  Wt Readings from Last 3 Encounters:  09/21/15 174 lb (78.926 kg)  09/18/14 178 lb 12.8 oz (81.103 kg)  09/03/13 184 lb (83.462 kg)    General: Patient appears comfortable at rest. HEENT: Conjunctiva and lids normal, oropharynx clear. Neck: Supple, no elevated JVP or carotid bruits, no thyromegaly. Lungs: Clear to auscultation, nonlabored breathing at rest. Cardiac: Regular rate and rhythm, no S3 or significant systolic murmur, no pericardial rub. Abdomen: Soft, nontender, bowel sounds present, no guarding or rebound. Extremities: No pitting edema, distal pulses 2+. Skin: Warm and dry. Musculoskeletal: No kyphosis. Neuropsychiatric: Alert and oriented x3, affect grossly appropriate.  ECG: I personally  reviewed the prior tracing from 09/18/2014 which showed sinus bradycardia.  Other Studies Reviewed Today:  Carotid Dopplers 09/24/2014: Stable 40-59% bilateral ICA stenoses.  Echocardiogram 03/01/2009: Study Conclusions  1. Left ventricle: The cavity size was normal. Wall thickness was  increased in a pattern of mild LVH. Systolic function was  vigorous. The estimated ejection fraction was in the range of 65%  to 70%. Although no diagnostic regional wall motion abnormality  was identified, this possibility cannot be completely excluded on  the basis of this study. Doppler parameters are consistent with  abnormal left ventricular relaxation (grade 1 diastolic  dysfunction). 2. Aortic valve: There was no stenosis. Peak gradient: 70mm Hg (S).  Mildly elevated peak gradient may be due to vigorous contraction  and a small mid cavity gradient. 3. Mitral valve: No significant regurgitation. 4. Right ventricle: The cavity size was normal. Systolic function  was normal. 5. Pulmonary arteries: No TR doppler jet was measured so unable to  estimate PA systolic pressure. 6. Inferior vena cava: The vessel was normal in size; the  respirophasic diameter changes were in the normal range (= 50%);  findings are consistent with normal central venous pressure.  Assessment and Plan:  1. Multivessel CAD status post CABG in 2010. He is asymptomatic at this time, states that he walks 4 miles most days a week for exercise. ECG reviewed. We will continue observation for now, can always discuss follow-up objective ischemic testing as time moves on.  2. Bilateral carotid artery disease, moderate. Follow-up carotid Dopplers. Continues on aspirin and statin.  3. Hyperlipidemia, on Zocor. Requesting most recent lab work from [name].  4. Essential hypertension, blood pressure is well controlled today.  Current medicines were reviewed with the patient  today.   Orders Placed This Encounter  Procedures  . EKG 12-Lead    Disposition: FU with me in 1 year.   Signed, Satira Sark, MD, [facility] 09/21/2015 10:30 AM    [place] at [place], [place], [place] [address] Phone: [phone]; Fax: [phone]

## 2015-09-22 ENCOUNTER — Ambulatory Visit: Payer: Medicare HMO

## 2015-09-22 DIAGNOSIS — I6523 Occlusion and stenosis of bilateral carotid arteries: Secondary | ICD-10-CM | POA: Diagnosis not present

## 2015-09-22 DIAGNOSIS — I779 Disorder of arteries and arterioles, unspecified: Secondary | ICD-10-CM

## 2015-09-22 DIAGNOSIS — I739 Peripheral vascular disease, unspecified: Principal | ICD-10-CM

## 2015-09-27 ENCOUNTER — Telehealth: Payer: Self-pay | Admitting: *Deleted

## 2015-09-27 NOTE — Telephone Encounter (Signed)
Patient returned your call.

## 2015-09-27 NOTE — Telephone Encounter (Signed)
-----   Message from Satira Sark, MD sent at 09/23/2015  7:29 AM EDT ----- Reviewed report. 1-39% bilateral ICA stenosis. We will continue medical therapy and observation.

## 2015-09-27 NOTE — Telephone Encounter (Signed)
Patient informed via message machine. 

## 2015-09-27 NOTE — Telephone Encounter (Signed)
Patient informed. 

## 2015-10-15 DIAGNOSIS — H66003 Acute suppurative otitis media without spontaneous rupture of ear drum, bilateral: Secondary | ICD-10-CM | POA: Diagnosis not present

## 2015-10-15 DIAGNOSIS — H9311 Tinnitus, right ear: Secondary | ICD-10-CM | POA: Diagnosis not present

## 2015-12-13 DIAGNOSIS — L259 Unspecified contact dermatitis, unspecified cause: Secondary | ICD-10-CM | POA: Diagnosis not present

## 2015-12-13 DIAGNOSIS — Z6827 Body mass index (BMI) 27.0-27.9, adult: Secondary | ICD-10-CM | POA: Diagnosis not present

## 2016-02-04 DIAGNOSIS — E782 Mixed hyperlipidemia: Secondary | ICD-10-CM | POA: Diagnosis not present

## 2016-02-04 DIAGNOSIS — E8881 Metabolic syndrome: Secondary | ICD-10-CM | POA: Diagnosis not present

## 2016-02-04 DIAGNOSIS — E781 Pure hyperglyceridemia: Secondary | ICD-10-CM | POA: Diagnosis not present

## 2016-02-04 DIAGNOSIS — I10 Essential (primary) hypertension: Secondary | ICD-10-CM | POA: Diagnosis not present

## 2016-02-04 DIAGNOSIS — E7800 Pure hypercholesterolemia, unspecified: Secondary | ICD-10-CM | POA: Diagnosis not present

## 2016-02-04 DIAGNOSIS — K7581 Nonalcoholic steatohepatitis (NASH): Secondary | ICD-10-CM | POA: Diagnosis not present

## 2016-02-04 DIAGNOSIS — Z13220 Encounter for screening for lipoid disorders: Secondary | ICD-10-CM | POA: Diagnosis not present

## 2016-02-17 DIAGNOSIS — R739 Hyperglycemia, unspecified: Secondary | ICD-10-CM | POA: Diagnosis not present

## 2016-02-17 DIAGNOSIS — E781 Pure hyperglyceridemia: Secondary | ICD-10-CM | POA: Diagnosis not present

## 2016-02-17 DIAGNOSIS — Z6829 Body mass index (BMI) 29.0-29.9, adult: Secondary | ICD-10-CM | POA: Diagnosis not present

## 2016-02-17 DIAGNOSIS — K7581 Nonalcoholic steatohepatitis (NASH): Secondary | ICD-10-CM | POA: Diagnosis not present

## 2016-02-17 DIAGNOSIS — I10 Essential (primary) hypertension: Secondary | ICD-10-CM | POA: Diagnosis not present

## 2016-02-17 DIAGNOSIS — E8881 Metabolic syndrome: Secondary | ICD-10-CM | POA: Diagnosis not present

## 2016-02-17 DIAGNOSIS — I259 Chronic ischemic heart disease, unspecified: Secondary | ICD-10-CM | POA: Diagnosis not present

## 2016-02-17 DIAGNOSIS — Z23 Encounter for immunization: Secondary | ICD-10-CM | POA: Diagnosis not present

## 2016-05-29 DIAGNOSIS — Z6830 Body mass index (BMI) 30.0-30.9, adult: Secondary | ICD-10-CM | POA: Diagnosis not present

## 2016-05-29 DIAGNOSIS — J209 Acute bronchitis, unspecified: Secondary | ICD-10-CM | POA: Diagnosis not present

## 2016-06-20 DIAGNOSIS — Z7982 Long term (current) use of aspirin: Secondary | ICD-10-CM | POA: Diagnosis not present

## 2016-06-20 DIAGNOSIS — E782 Mixed hyperlipidemia: Secondary | ICD-10-CM | POA: Diagnosis not present

## 2016-06-20 DIAGNOSIS — Z0000 Encounter for general adult medical examination without abnormal findings: Secondary | ICD-10-CM | POA: Diagnosis not present

## 2016-06-20 DIAGNOSIS — R51 Headache: Secondary | ICD-10-CM | POA: Diagnosis not present

## 2016-06-20 DIAGNOSIS — K08409 Partial loss of teeth, unspecified cause, unspecified class: Secondary | ICD-10-CM | POA: Diagnosis not present

## 2016-06-20 DIAGNOSIS — Z6829 Body mass index (BMI) 29.0-29.9, adult: Secondary | ICD-10-CM | POA: Diagnosis not present

## 2016-06-20 DIAGNOSIS — I10 Essential (primary) hypertension: Secondary | ICD-10-CM | POA: Diagnosis not present

## 2016-06-20 DIAGNOSIS — I209 Angina pectoris, unspecified: Secondary | ICD-10-CM | POA: Diagnosis not present

## 2016-08-09 DIAGNOSIS — E8881 Metabolic syndrome: Secondary | ICD-10-CM | POA: Diagnosis not present

## 2016-08-09 DIAGNOSIS — R739 Hyperglycemia, unspecified: Secondary | ICD-10-CM | POA: Diagnosis not present

## 2016-08-09 DIAGNOSIS — R748 Abnormal levels of other serum enzymes: Secondary | ICD-10-CM | POA: Diagnosis not present

## 2016-08-09 DIAGNOSIS — E7800 Pure hypercholesterolemia, unspecified: Secondary | ICD-10-CM | POA: Diagnosis not present

## 2016-08-09 DIAGNOSIS — I259 Chronic ischemic heart disease, unspecified: Secondary | ICD-10-CM | POA: Diagnosis not present

## 2016-08-09 DIAGNOSIS — E782 Mixed hyperlipidemia: Secondary | ICD-10-CM | POA: Diagnosis not present

## 2016-08-09 DIAGNOSIS — I10 Essential (primary) hypertension: Secondary | ICD-10-CM | POA: Diagnosis not present

## 2016-08-09 DIAGNOSIS — K7581 Nonalcoholic steatohepatitis (NASH): Secondary | ICD-10-CM | POA: Diagnosis not present

## 2016-08-09 DIAGNOSIS — E781 Pure hyperglyceridemia: Secondary | ICD-10-CM | POA: Diagnosis not present

## 2016-08-15 DIAGNOSIS — Z86010 Personal history of colonic polyps: Secondary | ICD-10-CM | POA: Diagnosis not present

## 2016-08-15 DIAGNOSIS — E8881 Metabolic syndrome: Secondary | ICD-10-CM | POA: Diagnosis not present

## 2016-08-15 DIAGNOSIS — R739 Hyperglycemia, unspecified: Secondary | ICD-10-CM | POA: Diagnosis not present

## 2016-08-15 DIAGNOSIS — Z23 Encounter for immunization: Secondary | ICD-10-CM | POA: Diagnosis not present

## 2016-08-15 DIAGNOSIS — I10 Essential (primary) hypertension: Secondary | ICD-10-CM | POA: Diagnosis not present

## 2016-08-15 DIAGNOSIS — I259 Chronic ischemic heart disease, unspecified: Secondary | ICD-10-CM | POA: Diagnosis not present

## 2016-08-15 DIAGNOSIS — K7581 Nonalcoholic steatohepatitis (NASH): Secondary | ICD-10-CM | POA: Diagnosis not present

## 2016-08-15 DIAGNOSIS — E781 Pure hyperglyceridemia: Secondary | ICD-10-CM | POA: Diagnosis not present

## 2016-09-22 NOTE — Progress Notes (Signed)
Cardiology Office Note  Date: 09/25/2016   ID: Kyle Boyd 1950-12-26, MRN [id number]  PCP: Manon Hilding, MD  Primary Cardiologist: Rozann Lesches, MD   Chief Complaint  Patient presents with  . Coronary Artery Disease    History of Present Illness: Kyle Boyd is a 66 y.o. male that I met in May 2017, a former patient of [name]. He presents for a routine follow-up visit. States that he has been doing well, no change in stamina, no obvious angina symptoms or use of nitroglycerin. He has an old bottle and needed a refill.  Patient has not undergone interval ischemic testing since 2010, we have discussed this. He prefers to hold off on testing, walks up to 4 miles at a time for exercise. I personally reviewed his ECG today which shows normal sinus rhythm.  We went over his medications. Current cardiac regimen includes aspirin, lisinopril, Lopressor, Zocor, and as needed nitroglycerin. He follows with [name] for primary care.  Follow-up carotid Dopplers from last year showed only mild, 1-39% bilateral ICA stenoses.  Past Medical History:  Diagnosis Date  . CAD (coronary artery disease)    Multivessel status post CABG 2010  . Carotid artery disease ([place])   . Erectile dysfunction   . Essential hypertension     Past Surgical History:  Procedure Laterality Date  . CORONARY ARTERY BYPASS GRAFT  October 2010   Dr. [name] - LIMA to LAD, SVG to diagonal, SVG to OM, SVG to RCA    Current Outpatient Prescriptions  Medication Sig Dispense Refill  . acetaminophen (TYLENOL) 500 MG tablet Take 500 mg by mouth every 6 (six) hours as needed.      . Alpha-D-Galactosidase (BEANO PO) Take 1 tablet by mouth as needed.      Marland Kitchen aspirin EC 81 MG tablet Take 1 tablet (81 mg total) by mouth daily. 90 tablet 3  . famotidine (PEPCID) 20 MG tablet Take 20 mg by mouth as needed.      Marland Kitchen lisinopril (PRINIVIL,ZESTRIL) 5 MG tablet Take 5 mg by mouth daily.     . metoprolol tartrate  (LOPRESSOR) 25 MG tablet Take 1 tablet (25 mg total) by mouth 2 (two) times daily. 60 tablet 6  . Multiple Vitamins-Minerals (MULTIVITAMIN WITH MINERALS) tablet Take 1 tablet by mouth daily.      . nitroGLYCERIN (NITROSTAT) 0.4 MG SL tablet Place 1 tablet (0.4 mg total) under the tongue every 5 (five) minutes x 3 doses as needed. If no relief after 3rd dose, proceed to the ED for an evaluation 25 tablet 3  . simvastatin (ZOCOR) 40 MG tablet Take 40 mg by mouth at bedtime.       No current facility-administered medications for this visit.    Allergies:  Patient has no known allergies.   Social History: The patient  reports that he has never smoked. He has never used smokeless tobacco. He reports that he does not drink alcohol or use drugs.   ROS:  Please see the history of present illness. Otherwise, complete review of systems is positive for none.  All other systems are reviewed and negative.   Physical Exam: VS:  BP 101/60   Pulse 60   Ht _0  (1.651 m)   Wt 176 lb 3.2 oz (79.9 kg)   SpO2 96%   BMI 29.32 kg/m , BMI Body mass index is 29.32 kg/m.  Wt Readings from Last 3 Encounters:  09/25/16 176 lb 3.2 oz (79.9  kg)  09/21/15 174 lb (78.9 kg)  09/18/14 178 lb 12.8 oz (81.1 kg)    General: Overweight male, appears comfortable at rest. HEENT: Conjunctiva and lids normal, oropharynx clear. Neck: Supple, no elevated JVP or carotid bruits, no thyromegaly. Lungs: Clear to auscultation, nonlabored breathing at rest. Cardiac: Regular rate and rhythm, no S3 or significant systolic murmur, no pericardial rub. Abdomen: Soft, nontender, bowel sounds present, no guarding or rebound. Extremities: No pitting edema, distal pulses 2+. Skin: Warm and dry. Musculoskeletal: No kyphosis. Neuropsychiatric: Alert and oriented x3, affect grossly appropriate.  ECG: I personally reviewed the tracing from 09/21/2015 which showed normal sinus rhythm with rightward axis.  Recent Labwork:  March 2017:  BUN 17, creatinine 0.89, potassium 4.8, AST 21, ALT 25, cholesterol 141, triglycerides 141, HDL 36, LDL 77  Other Studies Reviewed Today:  Carotid Dopplers 09/22/2015: 1-39% bilateral ICA stenoses.  Assessment and Plan:  1. CAD status post CABG in 2010. He remains symptomatically quite stable on medical therapy, ECG is normal today. He continues to walk regularly for exercise and prefers to hold off on follow-up ischemic testing for now. We discussed warning signs and symptoms, refill provided for nitroglycerin.  2. Mild carotid artery disease as outlined above. Asymptomatic. Continue aspirin and statin.  3. Hyperlipidemia, on Zocor. Last LDL 77. He follows lab work with [name].  4. Essential hypertension, blood pressure is well controlled today. No changes made in current regimen.  Current medicines were reviewed with the patient today.   Orders Placed This Encounter  Procedures  . EKG 12-Lead    Disposition: Follow-up in one year, sooner if needed.  Signed, Satira Sark, MD, [facility] 09/25/2016 9:00 [place] at [place], [place], [place] [address] Phone: [phone]; Fax: [phone]

## 2016-09-25 ENCOUNTER — Encounter: Payer: Self-pay | Admitting: Cardiology

## 2016-09-25 ENCOUNTER — Ambulatory Visit (INDEPENDENT_AMBULATORY_CARE_PROVIDER_SITE_OTHER): Payer: Medicare HMO | Admitting: Cardiology

## 2016-09-25 VITALS — BP 101/60 | HR 60 | Ht 65.0 in | Wt 176.2 lb

## 2016-09-25 DIAGNOSIS — I779 Disorder of arteries and arterioles, unspecified: Secondary | ICD-10-CM

## 2016-09-25 DIAGNOSIS — I10 Essential (primary) hypertension: Secondary | ICD-10-CM

## 2016-09-25 DIAGNOSIS — E782 Mixed hyperlipidemia: Secondary | ICD-10-CM

## 2016-09-25 DIAGNOSIS — I2510 Atherosclerotic heart disease of native coronary artery without angina pectoris: Secondary | ICD-10-CM | POA: Diagnosis not present

## 2016-09-25 DIAGNOSIS — I739 Peripheral vascular disease, unspecified: Secondary | ICD-10-CM

## 2016-09-25 MED ORDER — NITROGLYCERIN 0.4 MG SL SUBL: 0.4000 mg | SUBLINGUAL_TABLET | SUBLINGUAL | 3 refills | Status: DC | PRN

## 2016-09-25 NOTE — Patient Instructions (Signed)

## 2017-02-09 DIAGNOSIS — E8881 Metabolic syndrome: Secondary | ICD-10-CM | POA: Diagnosis not present

## 2017-02-09 DIAGNOSIS — E782 Mixed hyperlipidemia: Secondary | ICD-10-CM | POA: Diagnosis not present

## 2017-02-09 DIAGNOSIS — E7800 Pure hypercholesterolemia, unspecified: Secondary | ICD-10-CM | POA: Diagnosis not present

## 2017-02-09 DIAGNOSIS — I10 Essential (primary) hypertension: Secondary | ICD-10-CM | POA: Diagnosis not present

## 2017-02-09 DIAGNOSIS — E781 Pure hyperglyceridemia: Secondary | ICD-10-CM | POA: Diagnosis not present

## 2017-02-09 DIAGNOSIS — R739 Hyperglycemia, unspecified: Secondary | ICD-10-CM | POA: Diagnosis not present

## 2017-02-09 DIAGNOSIS — R748 Abnormal levels of other serum enzymes: Secondary | ICD-10-CM | POA: Diagnosis not present

## 2017-02-13 DIAGNOSIS — I259 Chronic ischemic heart disease, unspecified: Secondary | ICD-10-CM | POA: Diagnosis not present

## 2017-02-13 DIAGNOSIS — E8881 Metabolic syndrome: Secondary | ICD-10-CM | POA: Diagnosis not present

## 2017-02-13 DIAGNOSIS — K7581 Nonalcoholic steatohepatitis (NASH): Secondary | ICD-10-CM | POA: Diagnosis not present

## 2017-02-13 DIAGNOSIS — Z6829 Body mass index (BMI) 29.0-29.9, adult: Secondary | ICD-10-CM | POA: Diagnosis not present

## 2017-02-13 DIAGNOSIS — Z23 Encounter for immunization: Secondary | ICD-10-CM | POA: Diagnosis not present

## 2017-02-13 DIAGNOSIS — I10 Essential (primary) hypertension: Secondary | ICD-10-CM | POA: Diagnosis not present

## 2017-02-13 DIAGNOSIS — R739 Hyperglycemia, unspecified: Secondary | ICD-10-CM | POA: Diagnosis not present

## 2017-02-13 DIAGNOSIS — E781 Pure hyperglyceridemia: Secondary | ICD-10-CM | POA: Diagnosis not present

## 2017-03-05 DIAGNOSIS — K5732 Diverticulitis of large intestine without perforation or abscess without bleeding: Secondary | ICD-10-CM | POA: Diagnosis not present

## 2017-03-05 DIAGNOSIS — Z6829 Body mass index (BMI) 29.0-29.9, adult: Secondary | ICD-10-CM | POA: Diagnosis not present

## 2017-03-07 DIAGNOSIS — Z6829 Body mass index (BMI) 29.0-29.9, adult: Secondary | ICD-10-CM | POA: Diagnosis not present

## 2017-03-07 DIAGNOSIS — R112 Nausea with vomiting, unspecified: Secondary | ICD-10-CM | POA: Diagnosis not present

## 2017-03-07 DIAGNOSIS — R079 Chest pain, unspecified: Secondary | ICD-10-CM | POA: Diagnosis not present

## 2017-03-07 DIAGNOSIS — N132 Hydronephrosis with renal and ureteral calculous obstruction: Secondary | ICD-10-CM | POA: Diagnosis not present

## 2017-03-07 DIAGNOSIS — K5732 Diverticulitis of large intestine without perforation or abscess without bleeding: Secondary | ICD-10-CM | POA: Diagnosis not present

## 2017-03-07 DIAGNOSIS — I10 Essential (primary) hypertension: Secondary | ICD-10-CM | POA: Diagnosis not present

## 2017-03-07 DIAGNOSIS — I2510 Atherosclerotic heart disease of native coronary artery without angina pectoris: Secondary | ICD-10-CM | POA: Diagnosis not present

## 2017-03-07 DIAGNOSIS — Z7982 Long term (current) use of aspirin: Secondary | ICD-10-CM | POA: Diagnosis not present

## 2017-03-07 DIAGNOSIS — Z79899 Other long term (current) drug therapy: Secondary | ICD-10-CM | POA: Diagnosis not present

## 2017-03-07 DIAGNOSIS — Z87891 Personal history of nicotine dependence: Secondary | ICD-10-CM | POA: Diagnosis not present

## 2017-03-07 DIAGNOSIS — R1032 Left lower quadrant pain: Secondary | ICD-10-CM | POA: Diagnosis not present

## 2017-03-07 DIAGNOSIS — N200 Calculus of kidney: Secondary | ICD-10-CM | POA: Diagnosis not present

## 2017-03-09 DIAGNOSIS — E7800 Pure hypercholesterolemia, unspecified: Secondary | ICD-10-CM | POA: Diagnosis not present

## 2017-03-09 DIAGNOSIS — N200 Calculus of kidney: Secondary | ICD-10-CM | POA: Diagnosis not present

## 2017-03-09 DIAGNOSIS — R109 Unspecified abdominal pain: Secondary | ICD-10-CM | POA: Diagnosis not present

## 2017-03-09 DIAGNOSIS — Z7982 Long term (current) use of aspirin: Secondary | ICD-10-CM | POA: Diagnosis not present

## 2017-03-09 DIAGNOSIS — I10 Essential (primary) hypertension: Secondary | ICD-10-CM | POA: Diagnosis not present

## 2017-03-09 DIAGNOSIS — Z951 Presence of aortocoronary bypass graft: Secondary | ICD-10-CM | POA: Diagnosis not present

## 2017-03-09 DIAGNOSIS — Z79899 Other long term (current) drug therapy: Secondary | ICD-10-CM | POA: Diagnosis not present

## 2017-03-09 DIAGNOSIS — I2510 Atherosclerotic heart disease of native coronary artery without angina pectoris: Secondary | ICD-10-CM | POA: Diagnosis not present

## 2017-03-09 DIAGNOSIS — Z87891 Personal history of nicotine dependence: Secondary | ICD-10-CM | POA: Diagnosis not present

## 2017-03-12 DIAGNOSIS — N200 Calculus of kidney: Secondary | ICD-10-CM | POA: Diagnosis not present

## 2017-03-12 DIAGNOSIS — Z6829 Body mass index (BMI) 29.0-29.9, adult: Secondary | ICD-10-CM | POA: Diagnosis not present

## 2017-03-21 DIAGNOSIS — N200 Calculus of kidney: Secondary | ICD-10-CM | POA: Diagnosis not present

## 2017-06-21 DIAGNOSIS — Z6830 Body mass index (BMI) 30.0-30.9, adult: Secondary | ICD-10-CM | POA: Diagnosis not present

## 2017-06-21 DIAGNOSIS — E669 Obesity, unspecified: Secondary | ICD-10-CM | POA: Diagnosis not present

## 2017-06-21 DIAGNOSIS — I25119 Atherosclerotic heart disease of native coronary artery with unspecified angina pectoris: Secondary | ICD-10-CM | POA: Diagnosis not present

## 2017-06-21 DIAGNOSIS — I10 Essential (primary) hypertension: Secondary | ICD-10-CM | POA: Diagnosis not present

## 2017-06-21 DIAGNOSIS — Z809 Family history of malignant neoplasm, unspecified: Secondary | ICD-10-CM | POA: Diagnosis not present

## 2017-06-21 DIAGNOSIS — E785 Hyperlipidemia, unspecified: Secondary | ICD-10-CM | POA: Diagnosis not present

## 2017-06-21 DIAGNOSIS — Z7982 Long term (current) use of aspirin: Secondary | ICD-10-CM | POA: Diagnosis not present

## 2017-06-21 DIAGNOSIS — I252 Old myocardial infarction: Secondary | ICD-10-CM | POA: Diagnosis not present

## 2017-06-21 DIAGNOSIS — Z8249 Family history of ischemic heart disease and other diseases of the circulatory system: Secondary | ICD-10-CM | POA: Diagnosis not present

## 2017-06-21 DIAGNOSIS — G8929 Other chronic pain: Secondary | ICD-10-CM | POA: Diagnosis not present

## 2017-06-28 DIAGNOSIS — M25531 Pain in right wrist: Secondary | ICD-10-CM | POA: Diagnosis not present

## 2017-06-28 DIAGNOSIS — M25532 Pain in left wrist: Secondary | ICD-10-CM | POA: Diagnosis not present

## 2017-08-13 DIAGNOSIS — E781 Pure hyperglyceridemia: Secondary | ICD-10-CM | POA: Diagnosis not present

## 2017-08-13 DIAGNOSIS — R739 Hyperglycemia, unspecified: Secondary | ICD-10-CM | POA: Diagnosis not present

## 2017-08-13 DIAGNOSIS — E8881 Metabolic syndrome: Secondary | ICD-10-CM | POA: Diagnosis not present

## 2017-08-13 DIAGNOSIS — E782 Mixed hyperlipidemia: Secondary | ICD-10-CM | POA: Diagnosis not present

## 2017-08-13 DIAGNOSIS — I259 Chronic ischemic heart disease, unspecified: Secondary | ICD-10-CM | POA: Diagnosis not present

## 2017-08-13 DIAGNOSIS — E7800 Pure hypercholesterolemia, unspecified: Secondary | ICD-10-CM | POA: Diagnosis not present

## 2017-08-13 DIAGNOSIS — I10 Essential (primary) hypertension: Secondary | ICD-10-CM | POA: Diagnosis not present

## 2017-08-17 DIAGNOSIS — K7581 Nonalcoholic steatohepatitis (NASH): Secondary | ICD-10-CM | POA: Diagnosis not present

## 2017-08-17 DIAGNOSIS — Z1331 Encounter for screening for depression: Secondary | ICD-10-CM | POA: Diagnosis not present

## 2017-08-17 DIAGNOSIS — Z6829 Body mass index (BMI) 29.0-29.9, adult: Secondary | ICD-10-CM | POA: Diagnosis not present

## 2017-08-17 DIAGNOSIS — E781 Pure hyperglyceridemia: Secondary | ICD-10-CM | POA: Diagnosis not present

## 2017-08-17 DIAGNOSIS — I259 Chronic ischemic heart disease, unspecified: Secondary | ICD-10-CM | POA: Diagnosis not present

## 2017-08-17 DIAGNOSIS — Z1389 Encounter for screening for other disorder: Secondary | ICD-10-CM | POA: Diagnosis not present

## 2017-08-17 DIAGNOSIS — I10 Essential (primary) hypertension: Secondary | ICD-10-CM | POA: Diagnosis not present

## 2017-08-17 DIAGNOSIS — R739 Hyperglycemia, unspecified: Secondary | ICD-10-CM | POA: Diagnosis not present

## 2017-09-25 NOTE — Progress Notes (Signed)
Cardiology Office Note  Date: 09/26/2017   ID: Kyle, Boyd Jul 26, 1950, MRN [id number]  PCP: Manon Hilding, MD  Primary Cardiologist: Rozann Lesches, MD   Chief Complaint  Patient presents with  . Coronary Artery Disease    History of Present Illness: Kyle Boyd is a 67 y.o. male last seen in May 2018.  He presents for a routine visit.  Continues to walk twice a day for exercise, has had some trouble with his knees and feet, no claudication.  He does not report any angina symptoms or nitroglycerin use, has NYHA class II dyspnea with typical activities.  He has preferred to hold off on follow-up ischemic testing over time since CABG in 2010.  This remains the case after discussion today.  I reviewed his medications which are stable from a cardiac perspective and outlined below.  He had lab work with [name] in April, also reviewed below.  I personally reviewed his ECG today which shows sinus bradycardia with borderline low voltage.  Past Medical History:  Diagnosis Date  . CAD (coronary artery disease)    Multivessel status post CABG 2010  . Carotid artery disease ([place])   . Erectile dysfunction   . Essential hypertension     Past Surgical History:  Procedure Laterality Date  . CORONARY ARTERY BYPASS GRAFT  October 2010   Dr. [name] - LIMA to LAD, SVG to diagonal, SVG to OM, SVG to RCA    Current Outpatient Medications  Medication Sig Dispense Refill  . acetaminophen (TYLENOL) 500 MG tablet Take 500 mg by mouth every 6 (six) hours as needed.      . Alpha-D-Galactosidase (BEANO PO) Take 1 tablet by mouth as needed.      Marland Kitchen aspirin EC 81 MG tablet Take 1 tablet (81 mg total) by mouth daily. 90 tablet 3  . famotidine (PEPCID) 20 MG tablet Take 20 mg by mouth as needed.      Marland Kitchen lisinopril (PRINIVIL,ZESTRIL) 5 MG tablet Take 5 mg by mouth daily.     . metoprolol tartrate (LOPRESSOR) 25 MG tablet Take 1 tablet (25 mg total) by mouth 2 (two) times daily. 60 tablet  6  . Multiple Vitamins-Minerals (MULTIVITAMIN WITH MINERALS) tablet Take 1 tablet by mouth daily.      . nitroGLYCERIN (NITROSTAT) 0.4 MG SL tablet Place 1 tablet (0.4 mg total) under the tongue every 5 (five) minutes x 3 doses as needed. If no relief after 3rd dose, proceed to the ED for an evaluation 25 tablet 3  . simvastatin (ZOCOR) 40 MG tablet Take 40 mg by mouth at bedtime.       No current facility-administered medications for this visit.    Allergies:  Patient has no known allergies.   Social History: The patient  reports that he has never smoked. He has never used smokeless tobacco. He reports that he does not drink alcohol or use drugs.   ROS:  Please see the history of present illness. Otherwise, complete review of systems is positive for probable arthritic pain and stiffness in his legs.  All other systems are reviewed and negative.   Physical Exam: VS:  BP 104/66   Pulse (!) 56   Ht 5\' 5"  (1.651 m)   Wt 172 lb 6.4 oz (78.2 kg)   SpO2 94%   BMI 28.69 kg/m , BMI Body mass index is 28.69 kg/m.  Wt Readings from Last 3 Encounters:  09/26/17 172 lb 6.4 oz (78.2 kg)  09/25/16 176 lb 3.2 oz (79.9 kg)  09/21/15 174 lb (78.9 kg)    General: Patient appears comfortable at rest. HEENT: Conjunctiva and lids normal, oropharynx clear. Neck: Supple, no elevated JVP or carotid bruits, no thyromegaly. Lungs: Clear to auscultation, nonlabored breathing at rest. Cardiac: Regular rate and rhythm, no S3 or significant systolic murmur, no pericardial rub. Abdomen: Soft, nontender, bowel sounds present. Extremities: No pitting edema, distal pulses 2+. Skin: Warm and dry. Musculoskeletal: No kyphosis. Neuropsychiatric: Alert and oriented x3, affect grossly appropriate.  ECG: I personally reviewed the tracing from 09/25/2016 which showed normal sinus rhythm.  Recent Labwork:  April 2019: BUN14, creatinine0.93, potassium 4.6, AST 47, ALT 47, Hgb A1c 5.6, cholesterol 144, triglycerides  110, HDL 29, LDL73  Other Studies Reviewed Today:  Carotid Dopplers 09/22/2015: 1-39% bilateral ICA stenoses.  Assessment and Plan:  1.  CAD status post CABG in 2010.  He is doing well without angina including during regular walking for exercise.  He remains comfortable with observation, prefers to hold off on ischemic testing unless symptoms intervene.  No changes made to present regimen.  ECG reviewed.  2.  Mixed hyperlipidemia on Zocor.  Recent LDL 73.  3.  Essential hypertension, on lisinopril and Lopressor.  Blood pressure is normal today.  4.  History of mild carotid atherosclerosis, asymptomatic.  Continues on aspirin and statin.  Current medicines were reviewed with the patient today.   Orders Placed This Encounter  Procedures  . EKG 12-Lead    Disposition: Follow-up in 1 year.  Signed, Satira Sark, MD, [facility] 09/26/2017 9:52 AM    [facility] [place] [facility] [place], [place], [place] [address] Phone: [phone]; Fax: [phone]

## 2017-09-26 ENCOUNTER — Ambulatory Visit: Payer: Medicare HMO | Admitting: Cardiology

## 2017-09-26 ENCOUNTER — Encounter: Payer: Self-pay | Admitting: Cardiology

## 2017-09-26 VITALS — BP 104/66 | HR 56 | Ht 65.0 in | Wt 172.4 lb

## 2017-09-26 DIAGNOSIS — E782 Mixed hyperlipidemia: Secondary | ICD-10-CM | POA: Diagnosis not present

## 2017-09-26 DIAGNOSIS — I6523 Occlusion and stenosis of bilateral carotid arteries: Secondary | ICD-10-CM | POA: Diagnosis not present

## 2017-09-26 DIAGNOSIS — I2510 Atherosclerotic heart disease of native coronary artery without angina pectoris: Secondary | ICD-10-CM

## 2017-09-26 DIAGNOSIS — I10 Essential (primary) hypertension: Secondary | ICD-10-CM | POA: Diagnosis not present

## 2017-09-26 NOTE — Patient Instructions (Signed)

## 2017-11-21 DIAGNOSIS — Z0100 Encounter for examination of eyes and vision without abnormal findings: Secondary | ICD-10-CM | POA: Diagnosis not present

## 2017-11-21 DIAGNOSIS — I10 Essential (primary) hypertension: Secondary | ICD-10-CM | POA: Diagnosis not present

## 2017-12-07 DIAGNOSIS — R748 Abnormal levels of other serum enzymes: Secondary | ICD-10-CM | POA: Diagnosis not present

## 2017-12-07 DIAGNOSIS — E781 Pure hyperglyceridemia: Secondary | ICD-10-CM | POA: Diagnosis not present

## 2017-12-07 DIAGNOSIS — I10 Essential (primary) hypertension: Secondary | ICD-10-CM | POA: Diagnosis not present

## 2017-12-07 DIAGNOSIS — R739 Hyperglycemia, unspecified: Secondary | ICD-10-CM | POA: Diagnosis not present

## 2017-12-07 DIAGNOSIS — E782 Mixed hyperlipidemia: Secondary | ICD-10-CM | POA: Diagnosis not present

## 2017-12-07 DIAGNOSIS — E8881 Metabolic syndrome: Secondary | ICD-10-CM | POA: Diagnosis not present

## 2017-12-07 DIAGNOSIS — I259 Chronic ischemic heart disease, unspecified: Secondary | ICD-10-CM | POA: Diagnosis not present

## 2017-12-07 DIAGNOSIS — E7801 Familial hypercholesterolemia: Secondary | ICD-10-CM | POA: Diagnosis not present

## 2017-12-11 DIAGNOSIS — E781 Pure hyperglyceridemia: Secondary | ICD-10-CM | POA: Diagnosis not present

## 2017-12-11 DIAGNOSIS — I10 Essential (primary) hypertension: Secondary | ICD-10-CM | POA: Diagnosis not present

## 2017-12-11 DIAGNOSIS — E8881 Metabolic syndrome: Secondary | ICD-10-CM | POA: Diagnosis not present

## 2017-12-11 DIAGNOSIS — Z6829 Body mass index (BMI) 29.0-29.9, adult: Secondary | ICD-10-CM | POA: Diagnosis not present

## 2017-12-11 DIAGNOSIS — K7581 Nonalcoholic steatohepatitis (NASH): Secondary | ICD-10-CM | POA: Diagnosis not present

## 2017-12-11 DIAGNOSIS — Z86010 Personal history of colonic polyps: Secondary | ICD-10-CM | POA: Diagnosis not present

## 2017-12-11 DIAGNOSIS — R739 Hyperglycemia, unspecified: Secondary | ICD-10-CM | POA: Diagnosis not present

## 2017-12-11 DIAGNOSIS — I259 Chronic ischemic heart disease, unspecified: Secondary | ICD-10-CM | POA: Diagnosis not present

## 2018-03-14 DIAGNOSIS — M779 Enthesopathy, unspecified: Secondary | ICD-10-CM | POA: Diagnosis not present

## 2018-03-14 DIAGNOSIS — M79672 Pain in left foot: Secondary | ICD-10-CM | POA: Diagnosis not present

## 2018-04-09 DIAGNOSIS — E8881 Metabolic syndrome: Secondary | ICD-10-CM | POA: Diagnosis not present

## 2018-04-09 DIAGNOSIS — E782 Mixed hyperlipidemia: Secondary | ICD-10-CM | POA: Diagnosis not present

## 2018-04-09 DIAGNOSIS — R739 Hyperglycemia, unspecified: Secondary | ICD-10-CM | POA: Diagnosis not present

## 2018-04-09 DIAGNOSIS — E781 Pure hyperglyceridemia: Secondary | ICD-10-CM | POA: Diagnosis not present

## 2018-04-09 DIAGNOSIS — I259 Chronic ischemic heart disease, unspecified: Secondary | ICD-10-CM | POA: Diagnosis not present

## 2018-04-09 DIAGNOSIS — I10 Essential (primary) hypertension: Secondary | ICD-10-CM | POA: Diagnosis not present

## 2018-04-09 DIAGNOSIS — R748 Abnormal levels of other serum enzymes: Secondary | ICD-10-CM | POA: Diagnosis not present

## 2018-04-09 DIAGNOSIS — E7800 Pure hypercholesterolemia, unspecified: Secondary | ICD-10-CM | POA: Diagnosis not present

## 2018-04-09 DIAGNOSIS — E7801 Familial hypercholesterolemia: Secondary | ICD-10-CM | POA: Diagnosis not present

## 2018-04-11 DIAGNOSIS — M779 Enthesopathy, unspecified: Secondary | ICD-10-CM | POA: Diagnosis not present

## 2018-04-11 DIAGNOSIS — M79672 Pain in left foot: Secondary | ICD-10-CM | POA: Diagnosis not present

## 2018-04-12 DIAGNOSIS — Z6830 Body mass index (BMI) 30.0-30.9, adult: Secondary | ICD-10-CM | POA: Diagnosis not present

## 2018-04-12 DIAGNOSIS — R739 Hyperglycemia, unspecified: Secondary | ICD-10-CM | POA: Diagnosis not present

## 2018-04-12 DIAGNOSIS — Z86010 Personal history of colonic polyps: Secondary | ICD-10-CM | POA: Diagnosis not present

## 2018-04-12 DIAGNOSIS — E781 Pure hyperglyceridemia: Secondary | ICD-10-CM | POA: Diagnosis not present

## 2018-04-12 DIAGNOSIS — I259 Chronic ischemic heart disease, unspecified: Secondary | ICD-10-CM | POA: Diagnosis not present

## 2018-04-12 DIAGNOSIS — K7581 Nonalcoholic steatohepatitis (NASH): Secondary | ICD-10-CM | POA: Diagnosis not present

## 2018-04-12 DIAGNOSIS — Z23 Encounter for immunization: Secondary | ICD-10-CM | POA: Diagnosis not present

## 2018-04-12 DIAGNOSIS — I10 Essential (primary) hypertension: Secondary | ICD-10-CM | POA: Diagnosis not present

## 2018-04-18 DIAGNOSIS — H66001 Acute suppurative otitis media without spontaneous rupture of ear drum, right ear: Secondary | ICD-10-CM | POA: Diagnosis not present

## 2018-04-18 DIAGNOSIS — Z6830 Body mass index (BMI) 30.0-30.9, adult: Secondary | ICD-10-CM | POA: Diagnosis not present

## 2018-04-18 DIAGNOSIS — J069 Acute upper respiratory infection, unspecified: Secondary | ICD-10-CM | POA: Diagnosis not present

## 2018-05-06 DIAGNOSIS — M779 Enthesopathy, unspecified: Secondary | ICD-10-CM | POA: Diagnosis not present

## 2018-05-06 DIAGNOSIS — M79672 Pain in left foot: Secondary | ICD-10-CM | POA: Diagnosis not present

## 2018-05-31 DIAGNOSIS — M779 Enthesopathy, unspecified: Secondary | ICD-10-CM | POA: Diagnosis not present

## 2018-05-31 DIAGNOSIS — M79672 Pain in left foot: Secondary | ICD-10-CM | POA: Diagnosis not present

## 2018-06-26 DIAGNOSIS — M779 Enthesopathy, unspecified: Secondary | ICD-10-CM | POA: Diagnosis not present

## 2018-06-26 DIAGNOSIS — M79672 Pain in left foot: Secondary | ICD-10-CM | POA: Diagnosis not present

## 2018-07-17 DIAGNOSIS — M779 Enthesopathy, unspecified: Secondary | ICD-10-CM | POA: Diagnosis not present

## 2018-07-17 DIAGNOSIS — M79671 Pain in right foot: Secondary | ICD-10-CM | POA: Diagnosis not present

## 2018-08-14 DIAGNOSIS — S93332D Other subluxation of left foot, subsequent encounter: Secondary | ICD-10-CM | POA: Diagnosis not present

## 2018-08-14 DIAGNOSIS — M779 Enthesopathy, unspecified: Secondary | ICD-10-CM | POA: Diagnosis not present

## 2018-08-14 DIAGNOSIS — M79672 Pain in left foot: Secondary | ICD-10-CM | POA: Diagnosis not present

## 2018-09-11 DIAGNOSIS — M79672 Pain in left foot: Secondary | ICD-10-CM | POA: Diagnosis not present

## 2018-09-11 DIAGNOSIS — M779 Enthesopathy, unspecified: Secondary | ICD-10-CM | POA: Diagnosis not present

## 2018-09-11 DIAGNOSIS — G5762 Lesion of plantar nerve, left lower limb: Secondary | ICD-10-CM | POA: Diagnosis not present

## 2018-09-27 ENCOUNTER — Ambulatory Visit: Payer: Medicare HMO | Admitting: Cardiology

## 2018-10-10 DIAGNOSIS — M779 Enthesopathy, unspecified: Secondary | ICD-10-CM | POA: Diagnosis not present

## 2018-10-10 DIAGNOSIS — G5762 Lesion of plantar nerve, left lower limb: Secondary | ICD-10-CM | POA: Diagnosis not present

## 2018-10-10 DIAGNOSIS — M79672 Pain in left foot: Secondary | ICD-10-CM | POA: Diagnosis not present

## 2018-10-11 DIAGNOSIS — E782 Mixed hyperlipidemia: Secondary | ICD-10-CM | POA: Diagnosis not present

## 2018-10-11 DIAGNOSIS — I10 Essential (primary) hypertension: Secondary | ICD-10-CM | POA: Diagnosis not present

## 2018-10-11 DIAGNOSIS — R739 Hyperglycemia, unspecified: Secondary | ICD-10-CM | POA: Diagnosis not present

## 2018-10-11 DIAGNOSIS — I259 Chronic ischemic heart disease, unspecified: Secondary | ICD-10-CM | POA: Diagnosis not present

## 2018-10-11 DIAGNOSIS — E7800 Pure hypercholesterolemia, unspecified: Secondary | ICD-10-CM | POA: Diagnosis not present

## 2018-10-11 DIAGNOSIS — R748 Abnormal levels of other serum enzymes: Secondary | ICD-10-CM | POA: Diagnosis not present

## 2018-10-16 DIAGNOSIS — Z6830 Body mass index (BMI) 30.0-30.9, adult: Secondary | ICD-10-CM | POA: Diagnosis not present

## 2018-10-16 DIAGNOSIS — Z0001 Encounter for general adult medical examination with abnormal findings: Secondary | ICD-10-CM | POA: Diagnosis not present

## 2018-10-16 DIAGNOSIS — I10 Essential (primary) hypertension: Secondary | ICD-10-CM | POA: Diagnosis not present

## 2018-10-16 DIAGNOSIS — I259 Chronic ischemic heart disease, unspecified: Secondary | ICD-10-CM | POA: Diagnosis not present

## 2018-10-16 DIAGNOSIS — Z86010 Personal history of colonic polyps: Secondary | ICD-10-CM | POA: Diagnosis not present

## 2018-10-16 DIAGNOSIS — K7581 Nonalcoholic steatohepatitis (NASH): Secondary | ICD-10-CM | POA: Diagnosis not present

## 2018-10-16 DIAGNOSIS — E8881 Metabolic syndrome: Secondary | ICD-10-CM | POA: Diagnosis not present

## 2018-10-16 DIAGNOSIS — E781 Pure hyperglyceridemia: Secondary | ICD-10-CM | POA: Diagnosis not present

## 2018-11-11 DIAGNOSIS — M779 Enthesopathy, unspecified: Secondary | ICD-10-CM | POA: Diagnosis not present

## 2018-11-11 DIAGNOSIS — M79672 Pain in left foot: Secondary | ICD-10-CM | POA: Diagnosis not present

## 2018-12-03 DIAGNOSIS — K5900 Constipation, unspecified: Secondary | ICD-10-CM | POA: Diagnosis not present

## 2018-12-03 DIAGNOSIS — Z6830 Body mass index (BMI) 30.0-30.9, adult: Secondary | ICD-10-CM | POA: Diagnosis not present

## 2018-12-11 ENCOUNTER — Telehealth (INDEPENDENT_AMBULATORY_CARE_PROVIDER_SITE_OTHER): Payer: Medicare HMO | Admitting: Cardiology

## 2018-12-11 ENCOUNTER — Encounter: Payer: Self-pay | Admitting: Cardiology

## 2018-12-11 ENCOUNTER — Encounter: Payer: Self-pay | Admitting: *Deleted

## 2018-12-11 VITALS — BP 110/60 | Ht 65.0 in | Wt 177.0 lb

## 2018-12-11 DIAGNOSIS — I2510 Atherosclerotic heart disease of native coronary artery without angina pectoris: Secondary | ICD-10-CM

## 2018-12-11 DIAGNOSIS — E782 Mixed hyperlipidemia: Secondary | ICD-10-CM | POA: Diagnosis not present

## 2018-12-11 DIAGNOSIS — I10 Essential (primary) hypertension: Secondary | ICD-10-CM

## 2018-12-11 NOTE — Progress Notes (Signed)
Virtual Visit via Telephone Note   This visit type was conducted due to national recommendations for restrictions regarding the COVID-19 Pandemic (e.g. social distancing) in an effort to limit this patient's exposure and mitigate transmission in our community.  Due to his co-morbid illnesses, this patient is at least at moderate risk for complications without adequate follow up.  This format is felt to be most appropriate for this patient at this time.  The patient did not have access to video technology/had technical difficulties with video requiring transitioning to audio format only (telephone).  All issues noted in this document were discussed and addressed.  No physical exam could be performed with this format.  Please refer to the patient's chart for his  consent to telehealth for [facility].   Date:  12/11/2018   ID:  Kyle Boyd, DOB 1951/02/11, MRN [id number]  Patient Location: Home Provider Location: Office  PCP:  Manon Hilding, MD  Cardiologist:  Rozann Lesches, MD Electrophysiologist:  None   Evaluation Performed:  Follow-Up Visit  Chief Complaint:   Cardiac follow-up  History of Present Illness:    Kyle Boyd is a 68 y.o. male last seen in May 2019.  He did not have video access and we spoke by phone today.  He does not report any obvious angina symptoms, denies any nitroglycerin use.  He walks each morning for about an hour for exercise.  States that he feels tired when he is finished, but does not have exertional angina or increasing breathlessness on medical therapy.  He has preferred to hold off on follow-up ischemic testing over time.  We went over his medications which are stable and outlined below.  He reports having had lab work with [name] back in April.  We will request this for review.  The patient does not have symptoms concerning for COVID-19 infection (fever, chills, cough, or new shortness of breath).    Past Medical History:  Diagnosis Date   . CAD (coronary artery disease)    Multivessel status post CABG 2010  . Carotid artery disease ([place])   . Erectile dysfunction   . Essential hypertension    Past Surgical History:  Procedure Laterality Date  . CORONARY ARTERY BYPASS GRAFT  October 2010   Dr. [name] - LIMA to LAD, SVG to diagonal, SVG to OM, SVG to RCA     Current Meds  Medication Sig  . acetaminophen (TYLENOL) 500 MG tablet Take 500 mg by mouth every 6 (six) hours as needed.    . Alpha-D-Galactosidase (BEANO PO) Take 1 tablet by mouth as needed.    Marland Kitchen aspirin EC 81 MG tablet Take 1 tablet (81 mg total) by mouth daily.  . famotidine (PEPCID) 20 MG tablet Take 20 mg by mouth as needed.    Marland Kitchen lisinopril (PRINIVIL,ZESTRIL) 5 MG tablet Take 5 mg by mouth daily.   . metoprolol tartrate (LOPRESSOR) 25 MG tablet Take 1 tablet (25 mg total) by mouth 2 (two) times daily.  . nitroGLYCERIN (NITROSTAT) 0.4 MG SL tablet Place 1 tablet (0.4 mg total) under the tongue every 5 (five) minutes x 3 doses as needed. If no relief after 3rd dose, proceed to the ED for an evaluation  . simvastatin (ZOCOR) 40 MG tablet Take 40 mg by mouth at bedtime.       Allergies:   Patient has no known allergies.   Social History   Tobacco Use  . Smoking status: Never Smoker  . Smokeless tobacco: Never  Used  Substance Use Topics  . Alcohol use: No    Alcohol/week: 0.0 standard drinks  . Drug use: No     Family Hx: The patient's family history includes Heart disease in his mother.  ROS:   Please see the history of present illness. All other systems reviewed and are negative.   Prior CV studies:   The following studies were reviewed today:  Carotid Dopplers 09/22/2015: 1-39% bilateral ICA stenoses.  Labs/Other Tests and Data Reviewed:    EKG:  An ECG dated 09/26/2017 was personally reviewed today and demonstrated:  Sinus bradycardia with borderline low voltage.  Recent Labs:  April 2019: BUN14, creatinine 0.93, potassium 4.6, AST 47,  ALT 47, Hgb A1c 5.6, cholesterol 144, triglycerides 110, HDL 29, LDL 73  Wt Readings from Last 3 Encounters:  12/11/18 177 lb (80.3 kg)  09/26/17 172 lb 6.4 oz (78.2 kg)  09/25/16 176 lb 3.2 oz (79.9 kg)     Objective:    Vital Signs:  BP 110/60   Ht 5\' 5"  (1.651 m)   Wt 177 lb (80.3 kg)   BMI 29.45 kg/m    Patient spoke in full sentences, not short of breath. No audible wheezing or coughing. Slow and deliberate speech pattern.  ASSESSMENT & PLAN:    1.  Status post CABG in 2010.  He reports no obvious angina at this time, still walking an hour in the morning for exercise.  He has slowed down over the years, but remains comfortable with observation without follow-up ischemic testing at this point.  Continue with current regimen.  2.  Mixed hyperlipidemia on Zocor.  Requesting interval lab work from [name].  3.  Essential hypertension.  No change in current regimen.  Blood pressure is normal today.  COVID-19 Education: The signs and symptoms of COVID-19 were discussed with the patient and how to seek care for testing (follow up with PCP or arrange E-visit).  The importance of social distancing was discussed today.  Time:   Today, I have spent 5 minutes with the patient with telehealth technology discussing the above problems.     Medication Adjustments/Labs and Tests Ordered: Current medicines are reviewed at length with the patient today.  Concerns regarding medicines are outlined above.   Tests Ordered: No orders of the defined types were placed in this encounter.   Medication Changes: No orders of the defined types were placed in this encounter.   Follow Up:  In Person 6 months in the [place] office.  Signed, Rozann Lesches, MD  12/11/2018 9:43 AM    [place]

## 2018-12-11 NOTE — Patient Instructions (Addendum)

## 2019-02-05 DIAGNOSIS — E785 Hyperlipidemia, unspecified: Secondary | ICD-10-CM | POA: Diagnosis not present

## 2019-02-05 DIAGNOSIS — I10 Essential (primary) hypertension: Secondary | ICD-10-CM | POA: Diagnosis not present

## 2019-02-14 DIAGNOSIS — Z6829 Body mass index (BMI) 29.0-29.9, adult: Secondary | ICD-10-CM | POA: Diagnosis not present

## 2019-02-14 DIAGNOSIS — Z23 Encounter for immunization: Secondary | ICD-10-CM | POA: Diagnosis not present

## 2019-02-14 DIAGNOSIS — R42 Dizziness and giddiness: Secondary | ICD-10-CM | POA: Diagnosis not present

## 2019-04-14 DIAGNOSIS — E8881 Metabolic syndrome: Secondary | ICD-10-CM | POA: Diagnosis not present

## 2019-04-14 DIAGNOSIS — E782 Mixed hyperlipidemia: Secondary | ICD-10-CM | POA: Diagnosis not present

## 2019-04-14 DIAGNOSIS — E7800 Pure hypercholesterolemia, unspecified: Secondary | ICD-10-CM | POA: Diagnosis not present

## 2019-04-14 DIAGNOSIS — I10 Essential (primary) hypertension: Secondary | ICD-10-CM | POA: Diagnosis not present

## 2019-04-17 DIAGNOSIS — K7581 Nonalcoholic steatohepatitis (NASH): Secondary | ICD-10-CM | POA: Diagnosis not present

## 2019-04-17 DIAGNOSIS — N401 Enlarged prostate with lower urinary tract symptoms: Secondary | ICD-10-CM | POA: Diagnosis not present

## 2019-04-17 DIAGNOSIS — I10 Essential (primary) hypertension: Secondary | ICD-10-CM | POA: Diagnosis not present

## 2019-04-17 DIAGNOSIS — Z6831 Body mass index (BMI) 31.0-31.9, adult: Secondary | ICD-10-CM | POA: Diagnosis not present

## 2019-04-17 DIAGNOSIS — Z1331 Encounter for screening for depression: Secondary | ICD-10-CM | POA: Diagnosis not present

## 2019-04-17 DIAGNOSIS — E781 Pure hyperglyceridemia: Secondary | ICD-10-CM | POA: Diagnosis not present

## 2019-04-17 DIAGNOSIS — I259 Chronic ischemic heart disease, unspecified: Secondary | ICD-10-CM | POA: Diagnosis not present

## 2019-04-17 DIAGNOSIS — Z1389 Encounter for screening for other disorder: Secondary | ICD-10-CM | POA: Diagnosis not present

## 2019-05-08 DIAGNOSIS — I10 Essential (primary) hypertension: Secondary | ICD-10-CM | POA: Diagnosis not present

## 2019-05-08 DIAGNOSIS — E781 Pure hyperglyceridemia: Secondary | ICD-10-CM | POA: Diagnosis not present

## 2019-07-04 ENCOUNTER — Ambulatory Visit: Payer: Medicare HMO | Admitting: Cardiology

## 2019-07-21 NOTE — Progress Notes (Signed)
Virtual Visit via Telephone Note   This visit type was conducted due to national recommendations for restrictions regarding the COVID-19 Pandemic (e.g. social distancing) in an effort to limit this patient's exposure and mitigate transmission in our community.  Due to his co-morbid illnesses, this patient is at least at moderate risk for complications without adequate follow up.  This format is felt to be most appropriate for this patient at this time.  The patient did not have access to video technology/had technical difficulties with video requiring transitioning to audio format only (telephone).  All issues noted in this document were discussed and addressed.  No physical exam could be performed with this format.  Please refer to the patient's chart for his  consent to telehealth for [facility].   The patient was identified using 2 identifiers.  Date:  07/22/2019   ID:  Kyle Boyd, DOB 1950-07-26, MRN [id number]  Patient Location: Home Provider Location: Office  PCP:  Manon Hilding, MD  Cardiologist:  Rozann Lesches, MD Electrophysiologist:  None   Evaluation Performed:  Follow-Up Visit  Chief Complaint:  Cardiac follow-up  History of Present Illness:    Kyle Boyd is a 69 y.o. male last assessed via telehealth encounter in August 2020.  We spoke by phone today.  He does not report any angina symptoms or nitroglycerin use since last encounter.  He tells me that he has been walking 3 miles most days of the week, weather permitting.  No progressive shortness of breath.  I reviewed his medications.  Cardiac regimen includes aspirin, lisinopril, Lopressor, Zocor, and as needed nitroglycerin.  He has generally preferred to hold off on ischemic testing over time.  We did discuss getting a follow-up ECG for his next office visit.  The patient does not have symptoms concerning for COVID-19 infection (fever, chills, cough, or new shortness of breath).  He anticipates his second dose  of vaccine later this month.   Past Medical History:  Diagnosis Date  . CAD (coronary artery disease)    Multivessel status post CABG 2010  . Carotid artery disease ([place])   . Erectile dysfunction   . Essential hypertension    Past Surgical History:  Procedure Laterality Date  . CORONARY ARTERY BYPASS GRAFT  October 2010   Dr. [name] - LIMA to LAD, SVG to diagonal, SVG to OM, SVG to RCA     Current Meds  Medication Sig  . acetaminophen (TYLENOL) 500 MG tablet Take 500 mg by mouth every 6 (six) hours as needed.    . Alpha-D-Galactosidase (BEANO PO) Take 1 tablet by mouth as needed.    Marland Kitchen aspirin EC 81 MG tablet Take 1 tablet (81 mg total) by mouth daily.  . famotidine (PEPCID) 20 MG tablet Take 20 mg by mouth as needed.    Marland Kitchen lisinopril (PRINIVIL,ZESTRIL) 5 MG tablet Take 5 mg by mouth daily.   . metoprolol tartrate (LOPRESSOR) 25 MG tablet Take 1 tablet (25 mg total) by mouth 2 (two) times daily.  . Multiple Vitamins-Minerals (MULTIVITAMIN WITH MINERALS) tablet Take 1 tablet by mouth daily.    . nitroGLYCERIN (NITROSTAT) 0.4 MG SL tablet Place 1 tablet (0.4 mg total) under the tongue every 5 (five) minutes x 3 doses as needed. If no relief after 3rd dose, proceed to the ED for an evaluation  . simvastatin (ZOCOR) 40 MG tablet Take 40 mg by mouth at bedtime.       Allergies:   Patient has no known allergies.  ROS:   No palpitations or syncope.  Prior CV studies:   The following studies were reviewed today:  No interval cardiac testing for review today.  Labs/Other Tests and Data Reviewed:    EKG:  An ECG dated 09/26/2017 was personally reviewed today and demonstrated:  Sinus bradycardia with borderline low voltage.  Recent Labs:  June 2020: Cholesterol 141, TG 252, HDL 27, LDL 64, BUN 12, creatinine 0.95, potassium 4.4, AST 48, ALT 43  Wt Readings from Last 3 Encounters:  07/22/19 180 lb (81.6 kg)  12/11/18 177 lb (80.3 kg)  09/26/17 172 lb 6.4 oz (78.2 kg)      Objective:    Vital Signs:  BP 121/63   Pulse (!) 58   Ht 5\' 5"  (1.651 m)   Wt 180 lb (81.6 kg)   BMI 29.95 kg/m    Patient spoke in full sentences, not short of breath. No audible wheezing or coughing. Speech pattern normal.  ASSESSMENT & PLAN:    1.  CAD status post CABG in 2010.  He does not report any active angina or nitroglycerin use, walks 3 miles most days of the week for exercise.  Plan to continue observation on medical therapy including aspirin, lisinopril, Lopressor, Zocor, and as needed nitroglycerin.  ECG for next office visit.  2.  Mixed hyperlipidemia, he continues on Zocor.  Last LDL was 64.  Keep follow-up with [name].  Time:   Today, I have spent 5 minutes with the patient with telehealth technology discussing the above problems.     Medication Adjustments/Labs and Tests Ordered: Current medicines are reviewed at length with the patient today.  Concerns regarding medicines are outlined above.   Tests Ordered: No orders of the defined types were placed in this encounter.   Medication Changes: No orders of the defined types were placed in this encounter.   Follow Up:  In Person 6 months in the [place] office.  Signed, Rozann Lesches, MD  07/22/2019 9:30 AM    [facility]

## 2019-07-22 ENCOUNTER — Encounter: Payer: Self-pay | Admitting: Cardiology

## 2019-07-22 ENCOUNTER — Telehealth (INDEPENDENT_AMBULATORY_CARE_PROVIDER_SITE_OTHER): Payer: Medicare HMO | Admitting: Cardiology

## 2019-07-22 VITALS — BP 121/63 | HR 58 | Ht 65.0 in | Wt 180.0 lb

## 2019-07-22 DIAGNOSIS — E782 Mixed hyperlipidemia: Secondary | ICD-10-CM

## 2019-07-22 DIAGNOSIS — Z951 Presence of aortocoronary bypass graft: Secondary | ICD-10-CM

## 2019-07-22 DIAGNOSIS — I25119 Atherosclerotic heart disease of native coronary artery with unspecified angina pectoris: Secondary | ICD-10-CM

## 2019-07-22 DIAGNOSIS — I10 Essential (primary) hypertension: Secondary | ICD-10-CM

## 2019-07-22 NOTE — Patient Instructions (Addendum)

## 2019-08-06 DIAGNOSIS — I10 Essential (primary) hypertension: Secondary | ICD-10-CM | POA: Diagnosis not present

## 2019-08-06 DIAGNOSIS — E7849 Other hyperlipidemia: Secondary | ICD-10-CM | POA: Diagnosis not present

## 2019-08-11 DIAGNOSIS — E8881 Metabolic syndrome: Secondary | ICD-10-CM | POA: Diagnosis not present

## 2019-08-11 DIAGNOSIS — E7801 Familial hypercholesterolemia: Secondary | ICD-10-CM | POA: Diagnosis not present

## 2019-08-11 DIAGNOSIS — R739 Hyperglycemia, unspecified: Secondary | ICD-10-CM | POA: Diagnosis not present

## 2019-08-11 DIAGNOSIS — E781 Pure hyperglyceridemia: Secondary | ICD-10-CM | POA: Diagnosis not present

## 2019-08-11 DIAGNOSIS — E782 Mixed hyperlipidemia: Secondary | ICD-10-CM | POA: Diagnosis not present

## 2019-08-11 DIAGNOSIS — I10 Essential (primary) hypertension: Secondary | ICD-10-CM | POA: Diagnosis not present

## 2019-08-11 DIAGNOSIS — I259 Chronic ischemic heart disease, unspecified: Secondary | ICD-10-CM | POA: Diagnosis not present

## 2019-08-11 DIAGNOSIS — E7800 Pure hypercholesterolemia, unspecified: Secondary | ICD-10-CM | POA: Diagnosis not present

## 2019-08-14 DIAGNOSIS — Z6829 Body mass index (BMI) 29.0-29.9, adult: Secondary | ICD-10-CM | POA: Diagnosis not present

## 2019-08-14 DIAGNOSIS — K7581 Nonalcoholic steatohepatitis (NASH): Secondary | ICD-10-CM | POA: Diagnosis not present

## 2019-08-14 DIAGNOSIS — R739 Hyperglycemia, unspecified: Secondary | ICD-10-CM | POA: Diagnosis not present

## 2019-08-14 DIAGNOSIS — S161XXA Strain of muscle, fascia and tendon at neck level, initial encounter: Secondary | ICD-10-CM | POA: Diagnosis not present

## 2019-08-14 DIAGNOSIS — I259 Chronic ischemic heart disease, unspecified: Secondary | ICD-10-CM | POA: Diagnosis not present

## 2019-08-14 DIAGNOSIS — N401 Enlarged prostate with lower urinary tract symptoms: Secondary | ICD-10-CM | POA: Diagnosis not present

## 2019-08-14 DIAGNOSIS — I10 Essential (primary) hypertension: Secondary | ICD-10-CM | POA: Diagnosis not present

## 2019-08-14 DIAGNOSIS — E781 Pure hyperglyceridemia: Secondary | ICD-10-CM | POA: Diagnosis not present

## 2019-10-01 DIAGNOSIS — Z1211 Encounter for screening for malignant neoplasm of colon: Secondary | ICD-10-CM | POA: Diagnosis not present

## 2019-10-01 DIAGNOSIS — D126 Benign neoplasm of colon, unspecified: Secondary | ICD-10-CM | POA: Insufficient documentation

## 2019-10-04 DIAGNOSIS — E785 Hyperlipidemia, unspecified: Secondary | ICD-10-CM | POA: Diagnosis not present

## 2019-10-04 DIAGNOSIS — I25119 Atherosclerotic heart disease of native coronary artery with unspecified angina pectoris: Secondary | ICD-10-CM | POA: Diagnosis not present

## 2019-10-04 DIAGNOSIS — K5900 Constipation, unspecified: Secondary | ICD-10-CM | POA: Diagnosis not present

## 2019-10-04 DIAGNOSIS — I252 Old myocardial infarction: Secondary | ICD-10-CM | POA: Diagnosis not present

## 2019-10-04 DIAGNOSIS — Z6831 Body mass index (BMI) 31.0-31.9, adult: Secondary | ICD-10-CM | POA: Diagnosis not present

## 2019-10-04 DIAGNOSIS — Z008 Encounter for other general examination: Secondary | ICD-10-CM | POA: Diagnosis not present

## 2019-10-04 DIAGNOSIS — R32 Unspecified urinary incontinence: Secondary | ICD-10-CM | POA: Diagnosis not present

## 2019-10-04 DIAGNOSIS — M1990 Unspecified osteoarthritis, unspecified site: Secondary | ICD-10-CM | POA: Diagnosis not present

## 2019-10-04 DIAGNOSIS — N529 Male erectile dysfunction, unspecified: Secondary | ICD-10-CM | POA: Diagnosis not present

## 2019-10-04 DIAGNOSIS — E669 Obesity, unspecified: Secondary | ICD-10-CM | POA: Diagnosis not present

## 2019-10-04 DIAGNOSIS — I10 Essential (primary) hypertension: Secondary | ICD-10-CM | POA: Diagnosis not present

## 2019-10-07 DIAGNOSIS — H5203 Hypermetropia, bilateral: Secondary | ICD-10-CM | POA: Diagnosis not present

## 2019-10-07 DIAGNOSIS — H2513 Age-related nuclear cataract, bilateral: Secondary | ICD-10-CM | POA: Diagnosis not present

## 2019-10-07 DIAGNOSIS — H524 Presbyopia: Secondary | ICD-10-CM | POA: Diagnosis not present

## 2019-10-07 DIAGNOSIS — H52223 Regular astigmatism, bilateral: Secondary | ICD-10-CM | POA: Diagnosis not present

## 2019-10-28 DIAGNOSIS — Z01818 Encounter for other preprocedural examination: Secondary | ICD-10-CM | POA: Diagnosis not present

## 2019-10-30 DIAGNOSIS — Z1211 Encounter for screening for malignant neoplasm of colon: Secondary | ICD-10-CM | POA: Diagnosis not present

## 2019-10-30 DIAGNOSIS — K635 Polyp of colon: Secondary | ICD-10-CM | POA: Diagnosis not present

## 2019-10-30 DIAGNOSIS — Z87891 Personal history of nicotine dependence: Secondary | ICD-10-CM | POA: Diagnosis not present

## 2019-10-30 DIAGNOSIS — I129 Hypertensive chronic kidney disease with stage 1 through stage 4 chronic kidney disease, or unspecified chronic kidney disease: Secondary | ICD-10-CM | POA: Diagnosis not present

## 2019-10-30 DIAGNOSIS — D126 Benign neoplasm of colon, unspecified: Secondary | ICD-10-CM | POA: Diagnosis not present

## 2019-10-30 DIAGNOSIS — E785 Hyperlipidemia, unspecified: Secondary | ICD-10-CM | POA: Diagnosis not present

## 2019-10-30 DIAGNOSIS — Z951 Presence of aortocoronary bypass graft: Secondary | ICD-10-CM | POA: Diagnosis not present

## 2019-10-30 DIAGNOSIS — Z7982 Long term (current) use of aspirin: Secondary | ICD-10-CM | POA: Diagnosis not present

## 2019-10-30 DIAGNOSIS — N189 Chronic kidney disease, unspecified: Secondary | ICD-10-CM | POA: Diagnosis not present

## 2019-10-30 DIAGNOSIS — D125 Benign neoplasm of sigmoid colon: Secondary | ICD-10-CM | POA: Diagnosis not present

## 2019-10-30 DIAGNOSIS — Z79899 Other long term (current) drug therapy: Secondary | ICD-10-CM | POA: Diagnosis not present

## 2019-11-05 DIAGNOSIS — I10 Essential (primary) hypertension: Secondary | ICD-10-CM | POA: Diagnosis not present

## 2019-11-05 DIAGNOSIS — E7849 Other hyperlipidemia: Secondary | ICD-10-CM | POA: Diagnosis not present

## 2019-11-05 DIAGNOSIS — N138 Other obstructive and reflux uropathy: Secondary | ICD-10-CM | POA: Diagnosis not present

## 2019-11-05 DIAGNOSIS — N401 Enlarged prostate with lower urinary tract symptoms: Secondary | ICD-10-CM | POA: Diagnosis not present

## 2019-11-26 DIAGNOSIS — D125 Benign neoplasm of sigmoid colon: Secondary | ICD-10-CM | POA: Diagnosis not present

## 2019-12-08 DIAGNOSIS — H25813 Combined forms of age-related cataract, bilateral: Secondary | ICD-10-CM | POA: Diagnosis not present

## 2019-12-30 NOTE — H&P (Signed)
Surgical History & Physical  Patient Name: Kyle Boyd DOB: [birth year]  Surgery: Cataract extraction with intraocular lens implant phacoemulsification; Left Eye  Surgeon: Baruch Goldmann MD Surgery Date:  01/09/2020 Pre-Op Date:  12/30/2019  HPI: A 23 Yr. old male patient is referred by Dr Hassell Done for cataract eval. 1. 1. The patient complains of nighttime light - car headlights, street lamps etc. glare causing poor vision, which began 1 year ago. Both eyes are affected. The episode is gradual. Symptoms occur when the patient is driving and outside. The complaint is associated with glare. This is negatively affecting the patient's quality of life. HPI was performed by Baruch Goldmann .  Medical History: Cataracts Heart Problem High Blood Pressure Hypercholesteriemia  Review of Systems Negative Allergic/Immunologic Negative Cardiovascular Negative Constitutional Negative Ear, Nose, Mouth & Throat Negative Endocrine Negative Eyes Negative Gastrointestinal Negative Genitourinary Negative Hemotologic/Lymphatic Negative Integumentary Negative Musculoskeletal Negative Neurological Negative Psychiatry Negative Respiratory  Social   Never smoked   Medication Lisinopril, Meloxicam, Metoprolol, Simvastatin,   Sx/Procedures Bypass Surgery, Colonoscopy,   Drug Allergies  Poison ivy/oak,   History & Physical: Heent:  Cataract, Left eye NECK: supple without bruits LUNGS: lungs clear to auscultation CV: regular rate and rhythm Abdomen: soft and non-tender  Impression & Plan: Assessment: 1.  COMBINED FORMS AGE RELATED CATARACT; Both Eyes (H25.813)  Plan: 1.  Cataract accounts for the patient's decreased vision. This visual impairment is not correctable with a tolerable change in glasses or contact lenses. Cataract surgery with an implantation of a new lens should significantly improve the visual and functional status of the patient. Discussed all risks, benefits, alternatives,  and potential complications. Discussed the procedures and recovery. Patient desires to have surgery. A-scan ordered and performed today for intra-ocular lens calculations. The surgery will be performed in order to improve vision for driving, reading, and for eye examinations. Recommend phacoemulsification with intra-ocular lens. Left Eye non-dominant - first. Dilates well - shugarcaine by protocol.

## 2020-01-05 DIAGNOSIS — H25812 Combined forms of age-related cataract, left eye: Secondary | ICD-10-CM | POA: Diagnosis not present

## 2020-01-06 DIAGNOSIS — E7849 Other hyperlipidemia: Secondary | ICD-10-CM | POA: Diagnosis not present

## 2020-01-06 DIAGNOSIS — I10 Essential (primary) hypertension: Secondary | ICD-10-CM | POA: Diagnosis not present

## 2020-01-06 DIAGNOSIS — N138 Other obstructive and reflux uropathy: Secondary | ICD-10-CM | POA: Diagnosis not present

## 2020-01-06 DIAGNOSIS — N401 Enlarged prostate with lower urinary tract symptoms: Secondary | ICD-10-CM | POA: Diagnosis not present

## 2020-01-07 ENCOUNTER — Encounter (HOSPITAL_COMMUNITY)
Admission: RE | Admit: 2020-01-07 | Discharge: 2020-01-07 | Disposition: A | Discharge status: Home / Self Care | Payer: Medicare HMO | Source: Ambulatory Visit | Attending: Ophthalmology | Admitting: Ophthalmology

## 2020-01-07 ENCOUNTER — Other Ambulatory Visit (HOSPITAL_COMMUNITY)
Admission: RE | Admit: 2020-01-07 | Discharge: 2020-01-07 | Disposition: A | Discharge status: Home / Self Care | Payer: Medicare HMO | Source: Ambulatory Visit | Attending: Ophthalmology | Admitting: Ophthalmology

## 2020-01-07 ENCOUNTER — Encounter (HOSPITAL_COMMUNITY): Payer: Self-pay

## 2020-01-07 ENCOUNTER — Other Ambulatory Visit: Payer: Self-pay

## 2020-01-07 DIAGNOSIS — I10 Essential (primary) hypertension: Secondary | ICD-10-CM | POA: Insufficient documentation

## 2020-01-07 DIAGNOSIS — Z01818 Encounter for other preprocedural examination: Secondary | ICD-10-CM | POA: Diagnosis not present

## 2020-01-07 DIAGNOSIS — Z20822 Contact with and (suspected) exposure to covid-19: Secondary | ICD-10-CM | POA: Diagnosis not present

## 2020-01-07 LAB — SARS CORONAVIRUS 2 (TAT 6-24 HRS): SARS Coronavirus 2: NEGATIVE

## 2020-01-07 NOTE — Patient Instructions (Signed)
Kyle Boyd  01/07/2020     @PREFPERIOPPHARMACY @   Your procedure is scheduled on 01/09/2020.  Report to [facility] at 9:30 A.M.  Call this number if you have problems the morning of surgery:  [phone]   Remember:  Do not eat or drink after midnight.  Take these medicines the morning of surgery with A SIP OF WATER Metoprolol and Lisinopril    Do not wear jewelry, make-up or nail polish.  Do not wear lotions, powders, or perfumes, or deodorant.  Do not shave 48 hours prior to surgery.  Men may shave face and neck.  Do not bring valuables to the hospital.  [facility] is not responsible for any belongings or valuables.  Contacts, dentures or bridgework may not be worn into surgery.  Leave your suitcase in the car.  After surgery it may be brought to your room.  For patients admitted to the hospital, discharge time will be determined by your treatment team.  Patients discharged the day of surgery will not be allowed to drive home.   Name and phone number of your driver:   family Special instructions:  n/a  Please read over the following fact sheets that you were given. Care and Recovery After Surgery  Cataract Surgery Cataract surgery is a procedure to remove a cloudy lens (cataract) in the eye. The lens focuses light inside the eye. When a lens becomes cloudy, your vision is affected. Another lens (intraocular lens or IOL) is usually inserted to replace the cloudy lens and to properly focus light in the eye. Cataract surgery is usually recommended when the cataract is causing problems with daily functioning, such as reading, watching television, or driving. Tell a health care provider about:  Any allergies you have.  All medicines you are taking, including vitamins, herbs, eye drops, creams, and over-the-counter medicines.  Any problems you or family members have had with anesthetic medicines.  Any blood disorders you have.  Any surgeries you have had, especially eye  surgeries that include refractive surgery, such as PRK and LASIK.  Any medical conditions you have.  Whether you are pregnant or may be pregnant. What are the risks? Generally, this is a safe procedure. However, problems may occur, including:  Infection.  Bleeding.  High pressure in the eye (glaucoma).  Detachment of the retina.  Corneal swelling.  Allergic reactions to medicines.  Damage to other structures or organs.  Inflammation of the eye.  Clouding of the part of your eye that holds an IOL in place (after-cataract). This is common and can be treated at a later date with laser surgery.  An IOL moving out of position (rare).  Loss of vision (rare). What happens before the procedure? Staying hydrated Follow instructions from your health care provider about hydration, which may include:  Up to 2 to 6 hours before the procedure - you may continue to drink clear liquids, such as water, clear fruit juice, black coffee, and plain tea. Exact instructions will be given by your health care provider.  Eating and drinking restrictions Follow instructions from your health care provider about eating and drinking, which may include:  8 hours before the procedure - stop eating heavy meals or foods, such as meat, fried foods, or fatty foods.  6-8 hours before the procedure - stop eating light meals or foods, such as toast or cereal.  6-8 hours before the procedure - stop drinking milk or drinks that contain milk.  2-6 hours before the procedure -  stop drinking clear liquids. Medicines Ask your health care provider about:  Changing or stopping your regular medicines. This is especially important if you are taking diabetes medicines or blood thinners.  Taking medicines such as aspirin and ibuprofen. These medicines can thin your blood. Do not take these medicines unless your health care provider tells you to take them.  Taking over-the-counter medicines, vitamins, herbs, and  supplements. General instructions  Do not put contact lenses in either eye on the day of your surgery.  Plan to have someone take you home from the hospital or clinic.  If you will be going home right after the procedure, plan to have someone with you for 24 hours.  Ask your health care provider what steps will be taken to help prevent infection. These may include: ? Washing skin with a germ-killing soap. ? Taking antibiotic medicine. What happens during the procedure?      An IV may be inserted into one of your veins.  You will be given one or both of the following: ? A medicine to help you relax (sedative). ? A medicine to numb the area (local anesthetic). This may be numbing eye drops or an injection that is given behind the eye.  A small cut (incision) will be made to the edge of the clear surface that covers the front of the eye (cornea).  A small probe will be inserted into the eye. This device gives off ultrasound waves that soften and break up the cloudy center of the lens. This makes it easier for the cataract to be removed.  The cataract will be removed by suction.  An IOL may be implanted.  Part of the capsule that surrounds the lens will be left in the eye to support the IOL.  Your surgeon may use stitches (sutures) to close the incision. The procedure may vary among health care providers and hospitals. What happens after the procedure?  Your blood pressure, heart rate, breathing rate, and blood oxygen level will be monitored until you leave the hospital or clinic.  You may be given a protective shield to wear over your eye.  You may be given medicines to relieve discomfort and swelling. Some medicines may be in the form of eye drops.  Do not drive for 24 hours if you were given a sedative during your procedure. Summary  Cataract surgery is a procedure to remove a cloudy lens (cataract) in the eye.  This is a safe procedure. However, problems may occur,  including infection, bleeding, glaucoma, inflammation, and loss of vision.  Follow your health care provider's instructions about when to stop eating and drinking and whether to change or stop certain medicines.  After the procedure, you may be given medicines or an eye shield to wear over your eye.  Do not drive for 24 hours if you were given a sedative during your procedure. This information is not intended to replace advice given to you by your health care provider. Make sure you discuss any questions you have with your health care provider. Document Revised: 02/18/2019 Document Reviewed: 10/22/2017 Elsevier Patient Education  [employer].

## 2020-01-09 ENCOUNTER — Encounter (HOSPITAL_COMMUNITY)
Admission: RE | Disposition: A | Discharge status: Home / Self Care | Payer: Self-pay | Source: Home / Self Care | Attending: Ophthalmology

## 2020-01-09 ENCOUNTER — Ambulatory Visit (HOSPITAL_COMMUNITY)
Admission: RE | Admit: 2020-01-09 | Discharge: 2020-01-09 | Disposition: A | Discharge status: Home / Self Care | Payer: Medicare HMO | Attending: Ophthalmology | Admitting: Ophthalmology

## 2020-01-09 ENCOUNTER — Ambulatory Visit (HOSPITAL_COMMUNITY): Payer: Medicare HMO | Admitting: Anesthesiology

## 2020-01-09 ENCOUNTER — Other Ambulatory Visit: Payer: Self-pay

## 2020-01-09 ENCOUNTER — Encounter (HOSPITAL_COMMUNITY): Payer: Self-pay | Admitting: Ophthalmology

## 2020-01-09 DIAGNOSIS — R030 Elevated blood-pressure reading, without diagnosis of hypertension: Secondary | ICD-10-CM | POA: Diagnosis not present

## 2020-01-09 DIAGNOSIS — I2510 Atherosclerotic heart disease of native coronary artery without angina pectoris: Secondary | ICD-10-CM | POA: Diagnosis not present

## 2020-01-09 DIAGNOSIS — H25812 Combined forms of age-related cataract, left eye: Secondary | ICD-10-CM | POA: Insufficient documentation

## 2020-01-09 DIAGNOSIS — E7800 Pure hypercholesterolemia, unspecified: Secondary | ICD-10-CM | POA: Diagnosis not present

## 2020-01-09 DIAGNOSIS — I10 Essential (primary) hypertension: Secondary | ICD-10-CM | POA: Diagnosis not present

## 2020-01-09 HISTORY — PX: CATARACT EXTRACTION W/PHACO: SHX586

## 2020-01-09 SURGERY — PHACOEMULSIFICATION, CATARACT, WITH IOL INSERTION
Anesthesia: Monitor Anesthesia Care | Site: Eye | Laterality: Left

## 2020-01-09 MED ORDER — POVIDONE-IODINE 5 % OP SOLN
OPHTHALMIC | Status: DC | PRN
  Administered 2020-01-09: 1 via OPHTHALMIC

## 2020-01-09 MED ORDER — SODIUM HYALURONATE 23 MG/ML IO SOLN
INTRAOCULAR | Status: DC | PRN
  Administered 2020-01-09: 0.6 mL via INTRAOCULAR

## 2020-01-09 MED ORDER — EPINEPHRINE PF 1 MG/ML IJ SOLN
INTRAMUSCULAR | Status: AC
  Filled 2020-01-09: qty 2

## 2020-01-09 MED ORDER — PROVISC 10 MG/ML IO SOLN
INTRAOCULAR | Status: DC | PRN
  Administered 2020-01-09: 0.85 mL via INTRAOCULAR

## 2020-01-09 MED ORDER — CYCLOPENTOLATE-PHENYLEPHRINE 0.2-1 % OP SOLN
1.0000 [drp] | OPHTHALMIC | Status: AC | PRN
  Administered 2020-01-09 (×3): 1 [drp] via OPHTHALMIC

## 2020-01-09 MED ORDER — TETRACAINE HCL 0.5 % OP SOLN
1.0000 [drp] | OPHTHALMIC | Status: AC | PRN
  Administered 2020-01-09 (×3): 1 [drp] via OPHTHALMIC

## 2020-01-09 MED ORDER — LIDOCAINE HCL (PF) 1 % IJ SOLN
INTRAOCULAR | Status: DC | PRN
  Administered 2020-01-09: 1 mL via OPHTHALMIC

## 2020-01-09 MED ORDER — NEOMYCIN-POLYMYXIN-DEXAMETH 3.5-10000-0.1 OP SUSP
OPHTHALMIC | Status: DC | PRN
  Administered 2020-01-09: 1 [drp] via OPHTHALMIC

## 2020-01-09 MED ORDER — LIDOCAINE HCL 3.5 % OP GEL
1.0000 "application " | Freq: Once | OPHTHALMIC | Status: AC
  Administered 2020-01-09: 1 via OPHTHALMIC

## 2020-01-09 MED ORDER — EPINEPHRINE PF 1 MG/ML IJ SOLN
INTRAOCULAR | Status: DC | PRN
  Administered 2020-01-09: 500 mL

## 2020-01-09 MED ORDER — PHENYLEPHRINE HCL 2.5 % OP SOLN
1.0000 [drp] | OPHTHALMIC | Status: AC | PRN
  Administered 2020-01-09 (×3): 1 [drp] via OPHTHALMIC

## 2020-01-09 MED ORDER — BSS IO SOLN
INTRAOCULAR | Status: DC | PRN
  Administered 2020-01-09: 15 mL via INTRAOCULAR

## 2020-01-09 SURGICAL SUPPLY — 12 items
CLOTH BEACON ORANGE TIMEOUT ST (SAFETY) ×2 IMPLANT
EYE SHIELD UNIVERSAL CLEAR (GAUZE/BANDAGES/DRESSINGS) ×2 IMPLANT
GLOVE BIOGEL PI IND STRL 7.0 (GLOVE) ×2 IMPLANT
GLOVE BIOGEL PI INDICATOR 7.0 (GLOVE) ×2
LENS ALC ACRYL/TECN (Ophthalmic Related) ×2 IMPLANT
NEEDLE HYPO 18GX1.5 BLUNT FILL (NEEDLE) ×2 IMPLANT
PAD ARMBOARD 7.5X6 YLW CONV (MISCELLANEOUS) ×2 IMPLANT
SYR TB 1ML LL NO SAFETY (SYRINGE) ×2 IMPLANT
TAPE SURG TRANSPORE 1 IN (GAUZE/BANDAGES/DRESSINGS) ×1 IMPLANT
TAPE SURGICAL TRANSPORE 1 IN (GAUZE/BANDAGES/DRESSINGS) ×1
VISCOELASTIC ADDITIONAL (OPHTHALMIC RELATED) ×2 IMPLANT
WATER STERILE IRR 250ML POUR (IV SOLUTION) ×2 IMPLANT

## 2020-01-09 NOTE — Op Note (Signed)
Date of procedure: 01/09/20  Pre-operative diagnosis: Visually significant age-related combined cataract, Left Eye (H25.812)  Post-operative diagnosis: Visually significant age-related combined cataract, Left Eye (H25.812)  Procedure: Removal of cataract via phacoemulsification and insertion of intra-ocular lens [place]  +22.0D into the capsular bag of the Left Eye  Attending surgeon: Gerda Diss. Thelmer Legler, MD, MA  Anesthesia: MAC, Topical [place]  Complications: None  Estimated Blood Loss: <11m (minimal)  Specimens: None  Implants: As above  Indications:  Visually significant age-related cataract, Left Eye  Procedure:  The patient was seen and identified in the pre-operative area. The operative eye was identified and dilated.  The operative eye was marked.  Topical anesthesia was administered to the operative eye.     The patient was then to the operative suite and placed in the supine position.  A timeout was performed confirming the patient, procedure to be performed, and all other relevant information.   The patient's face was prepped and draped in the usual fashion for intra-ocular surgery.  A lid speculum was placed into the operative eye and the surgical microscope moved into place and focused.  An inferotemporal paracentesis was created using a 20 gauge paracentesis blade.  Shugarcaine was injected into the anterior chamber.  Viscoelastic was injected into the anterior chamber.  A temporal clear-corneal main wound incision was created using a 2.478mmicrokeratome.  A continuous curvilinear capsulorrhexis was initiated using an irrigating cystitome and completed using capsulorrhexis forceps.  Hydrodissection and hydrodeliniation were performed.  Viscoelastic was injected into the anterior chamber.  A phacoemulsification handpiece and a chopper as a second instrument were used to remove the nucleus and epinucleus. The irrigation/aspiration handpiece was used to remove  any remaining cortical material.   The capsular bag was reinflated with viscoelastic, checked, and found to be intact.  The intraocular lens was inserted into the capsular bag.  The irrigation/aspiration handpiece was used to remove any remaining viscoelastic.  The clear corneal wound and paracentesis wounds were then hydrated and checked with Weck-Cels to be watertight.  The lid-speculum was removed.  The drape was removed.  The patient's face was cleaned with a wet and dry 4x4.   Maxitrol was instilled in the eye. A clear shield was taped over the eye. The patient was taken to the post-operative care unit in good condition, having tolerated the procedure well.  Post-Op Instructions: [facility] and will receive all other orders and instructions.

## 2020-01-09 NOTE — Anesthesia Postprocedure Evaluation (Signed)
Anesthesia Post Note  Patient: Kyle Boyd  Procedure(s) Performed: CATARACT EXTRACTION PHACO AND INTRAOCULAR LENS PLACEMENT (IOC) (Left Eye)  Patient location during evaluation: Short Stay Anesthesia Type: MAC Level of consciousness: awake and alert Pain management: pain level controlled Vital Signs Assessment: post-procedure vital signs reviewed and stable Respiratory status: spontaneous breathing Cardiovascular status: stable Postop Assessment: able to ambulate Anesthetic complications: no   No complications documented.   Last Vitals:  Vitals:   01/09/20 1116 01/09/20 1158  BP: 140/76 (!) 151/68  Pulse: (!) 58 (!) 58  Resp: 18 16  Temp: 36.6 C 36.8 C  SpO2: 98% 98%    Last Pain:  Vitals:   01/09/20 1158  TempSrc: Oral  PainSc: 1                  Everette Rank

## 2020-01-09 NOTE — Transfer of Care (Signed)
Immediate Anesthesia Transfer of Care Note  Patient: Kyle Boyd  Procedure(s) Performed: CATARACT EXTRACTION PHACO AND INTRAOCULAR LENS PLACEMENT (IOC) (Left Eye)  Patient Location: Short Stay  Anesthesia Type:MAC  Level of Consciousness: awake, alert , oriented and patient cooperative  Airway & Oxygen Therapy: Patient Spontanous Breathing  Post-op Assessment: Report given to RN and Post -op Vital signs reviewed and stable  Post vital signs: Reviewed and stable  Last Vitals:  Vitals Value Taken Time  BP    Temp    Pulse    Resp    SpO2      Last Pain:  Vitals:   01/09/20 1116  TempSrc: Oral  PainSc: 0-No pain      Patients Stated Pain Goal: 5 (33/61/22 [id number])  Complications: No complications documented.

## 2020-01-09 NOTE — Anesthesia Preprocedure Evaluation (Signed)
Anesthesia Evaluation  Patient identified by MRN, date of birth, ID band Patient awake    Reviewed: Allergy & Precautions, NPO status , Patient's Chart, lab work & pertinent test results, reviewed documented beta blocker date and time   History of Anesthesia Complications Negative for: history of anesthetic complications  Airway Mallampati: I  TM Distance: >3 FB Neck ROM: Full   Comment: Cleft palate repair  Dental  (+) Missing, Chipped, Dental Advisory Given, Poor Dentition   Pulmonary shortness of breath and with exertion,    Pulmonary exam normal breath sounds clear to auscultation       Cardiovascular Exercise Tolerance: Good METS: 3 - Mets hypertension, Pt. on medications and Pt. on home beta blockers + CAD and + CABG (2010)  Normal cardiovascular exam Rhythm:Regular Rate:Normal - Systolic murmurs, - Diastolic murmurs, - Friction Rub, - Carotid Bruit, - Peripheral Edema and - Systolic Click [id number] 94:07:68 [place] [facility]-AP-OPS ROUTINE RECORD Normal sinus rhythm Normal ECG Confirmed by Loralie Champagne ([id number]) on 01/07/2020 4:34:03 PM   Neuro/Psych negative neurological ROS  negative psych ROS   GI/Hepatic negative GI ROS, Neg liver ROS,   Endo/Other  negative endocrine ROS  Renal/GU negative Renal ROS     Musculoskeletal negative musculoskeletal ROS (+)   Abdominal   Peds  Hematology negative hematology ROS (+)   Anesthesia Other Findings Cleft palate repair  Reproductive/Obstetrics negative OB ROS                           Anesthesia Physical Anesthesia Plan  ASA: III  Anesthesia Plan: MAC   Post-op Pain Management:    Induction: Intravenous  PONV Risk Score and Plan:   Airway Management Planned: Nasal Cannula and Natural Airway  Additional Equipment:   Intra-op Plan:   Post-operative Plan:   Informed Consent: I have reviewed the patients History and  Physical, chart, labs and discussed the procedure including the risks, benefits and alternatives for the proposed anesthesia with the patient or authorized representative who has indicated his/her understanding and acceptance.     Dental advisory given  Plan Discussed with: CRNA and Surgeon  Anesthesia Plan Comments:         Anesthesia Quick Evaluation

## 2020-01-09 NOTE — Discharge Instructions (Signed)
Please discharge patient when stable, will follow up today with Dr. Assia Meanor at the [facility] office immediately following discharge.  Leave shield in place until visit.  All paperwork with discharge instructions will be given at the office.  [facility] Address:  [address]  [place], [place] [address]  

## 2020-01-09 NOTE — Interval H&P Note (Signed)
History and Physical Interval Note:  01/09/2020 11:30 AM  Kyle Boyd  has presented today for surgery, with the diagnosis of Nuclear sclerotic cataract - Left eye.  The various methods of treatment have been discussed with the patient and family. After consideration of risks, benefits and other options for treatment, the patient has consented to  Procedure(s) with comments: CATARACT EXTRACTION PHACO AND INTRAOCULAR LENS PLACEMENT ([place]) (Left) - left as a surgical intervention.  The patient's history has been reviewed, patient examined, no change in status, stable for surgery.  I have reviewed the patient's chart and labs.  Questions were answered to the patient's satisfaction.     Baruch Goldmann

## 2020-01-16 NOTE — H&P (Signed)
Surgical History & Physical  Patient Name: Kyle Boyd DOB: 08-Sep-1950  Surgery: Cataract extraction with intraocular lens implant phacoemulsification; Right Eye  Surgeon: Baruch Goldmann MD Surgery Date:  01/23/2020 Pre-Op Date:  01/15/2020  HPI: A 41 Yr. old male patient The patient is returning after cataract surgery. The left eye is affected. Status post cataract surgery, which began 1 weeks ago: Since the last visit, the affected area feels improvement and is doing well. The patient's vision is improved and stable. Patient is following medication instructions. Omni TID OS. A few days after procedure, pt experienced "black lines" in peripheral vision. Pt states they do not move with eye movement. Pt states that they seems to come and go. The patient experiences no flashes of light. The patient complains of headlights glare causing poor vision. The right eye is affected. The episode is constant. Symptoms occur when the patient is outside. The complaint is associated with blurry vision. Poor vision is negatively effecting pt's quality of life. HPI was performed by Baruch Goldmann .  Medical History: Cataracts Heart Problem High Blood Pressure Hypercholesteriemia  Review of Systems Eyes Vision loss All recorded systems are negative except as noted above.  Social   Never smoked  Medication Prednisolone-gatiflox-bromfenac,  Lisinopril, Meloxicam, Metoprolol, Simvastatin,   Sx/Procedures Phaco c IOL OS,  Bypass Surgery, Colonoscopy,   Drug Allergies  Poison ivy/oak,   History & Physical: Heent:  Cataract, Right eye NECK: supple without bruits LUNGS: lungs clear to auscultation CV: regular rate and rhythm Abdomen: soft and non-tender  Impression & Plan: Assessment: 1.  CATARACT EXTRACTION STATUS; Left Eye (Z98.42) 2.  INTRAOCULAR LENS IOL (Z96.1) 3.  COMBINED FORMS AGE RELATED CATARACT; Right Eye (H25.811) 4.  BLEPHARITIS; Right Upper Lid, Right Lower Lid, Left Upper Lid,  Left Lower Lid (H01.001, H01.002,H01.004,H01.005) 5.  DERMATOCHALASIS, no surgery; Right Upper Lid, Left Upper Lid (H02.831, Y65.993)  Plan: 1.  1 week after cataract surgery. Doing well with improved vision and normal eye pressure. Call with any problems or concerns. Continue Gati-Brom-Pred 2x/day for 3 more weeks. 2.  Stable. Doing well since surgery Continue Post-op medications 3.  Cataract accounts for the patient's decreased vision. This visual impairment is not correctable with a tolerable change in glasses or contact lenses. Cataract surgery with an implantation of a new lens should significantly improve the visual and functional status of the patient. Discussed all risks, benefits, alternatives, and potential complications. Discussed the procedures and recovery. Patient desires to have surgery. A-scan ordered and performed today for intra-ocular lens calculations. The surgery will be performed in order to improve vision for driving, reading, and for eye examinations. Recommend phacoemulsification with intra-ocular lens. Recommend Dextenza for post-operative pain and inflammation. Right Eye. Surgery required to correct imbalance of vision. Dilates well - shugarcaine by protocol. 4.  Begin/continue lid scrubs. 5.  Asymptomatic, recommend observation for now. Findings, prognosis and treatment options reviewed.

## 2020-01-19 DIAGNOSIS — H25811 Combined forms of age-related cataract, right eye: Secondary | ICD-10-CM | POA: Diagnosis not present

## 2020-01-20 ENCOUNTER — Encounter (HOSPITAL_COMMUNITY)
Admission: RE | Admit: 2020-01-20 | Discharge: 2020-01-20 | Disposition: A | Discharge status: Home / Self Care | Payer: Medicare HMO | Source: Ambulatory Visit | Attending: Ophthalmology | Admitting: Ophthalmology

## 2020-01-20 ENCOUNTER — Other Ambulatory Visit: Payer: Self-pay

## 2020-01-21 ENCOUNTER — Other Ambulatory Visit: Payer: Self-pay

## 2020-01-21 ENCOUNTER — Other Ambulatory Visit (HOSPITAL_COMMUNITY)
Admission: RE | Admit: 2020-01-21 | Discharge: 2020-01-21 | Disposition: A | Discharge status: Home / Self Care | Payer: Medicare HMO | Source: Ambulatory Visit | Attending: Ophthalmology | Admitting: Ophthalmology

## 2020-01-21 DIAGNOSIS — Z20822 Contact with and (suspected) exposure to covid-19: Secondary | ICD-10-CM | POA: Diagnosis not present

## 2020-01-21 DIAGNOSIS — Z01812 Encounter for preprocedural laboratory examination: Secondary | ICD-10-CM | POA: Diagnosis not present

## 2020-01-21 LAB — SARS CORONAVIRUS 2 (TAT 6-24 HRS): SARS Coronavirus 2: NEGATIVE

## 2020-01-23 ENCOUNTER — Ambulatory Visit (HOSPITAL_COMMUNITY): Payer: Medicare HMO | Admitting: Anesthesiology

## 2020-01-23 ENCOUNTER — Encounter (HOSPITAL_COMMUNITY)
Admission: RE | Disposition: A | Discharge status: Home / Self Care | Payer: Self-pay | Source: Home / Self Care | Attending: Ophthalmology

## 2020-01-23 ENCOUNTER — Encounter (HOSPITAL_COMMUNITY): Payer: Self-pay | Admitting: Ophthalmology

## 2020-01-23 ENCOUNTER — Ambulatory Visit (HOSPITAL_COMMUNITY)
Admission: RE | Admit: 2020-01-23 | Discharge: 2020-01-23 | Disposition: A | Discharge status: Home / Self Care | Payer: Medicare HMO | Attending: Ophthalmology | Admitting: Ophthalmology

## 2020-01-23 DIAGNOSIS — I2510 Atherosclerotic heart disease of native coronary artery without angina pectoris: Secondary | ICD-10-CM | POA: Insufficient documentation

## 2020-01-23 DIAGNOSIS — H0100A Unspecified blepharitis right eye, upper and lower eyelids: Secondary | ICD-10-CM | POA: Diagnosis not present

## 2020-01-23 DIAGNOSIS — H0100B Unspecified blepharitis left eye, upper and lower eyelids: Secondary | ICD-10-CM | POA: Insufficient documentation

## 2020-01-23 DIAGNOSIS — Z951 Presence of aortocoronary bypass graft: Secondary | ICD-10-CM | POA: Insufficient documentation

## 2020-01-23 DIAGNOSIS — E785 Hyperlipidemia, unspecified: Secondary | ICD-10-CM | POA: Diagnosis not present

## 2020-01-23 DIAGNOSIS — H25811 Combined forms of age-related cataract, right eye: Secondary | ICD-10-CM | POA: Diagnosis not present

## 2020-01-23 DIAGNOSIS — I10 Essential (primary) hypertension: Secondary | ICD-10-CM | POA: Diagnosis not present

## 2020-01-23 DIAGNOSIS — Z9842 Cataract extraction status, left eye: Secondary | ICD-10-CM | POA: Diagnosis not present

## 2020-01-23 DIAGNOSIS — E7800 Pure hypercholesterolemia, unspecified: Secondary | ICD-10-CM | POA: Insufficient documentation

## 2020-01-23 DIAGNOSIS — Z79899 Other long term (current) drug therapy: Secondary | ICD-10-CM | POA: Insufficient documentation

## 2020-01-23 DIAGNOSIS — H02834 Dermatochalasis of left upper eyelid: Secondary | ICD-10-CM | POA: Insufficient documentation

## 2020-01-23 DIAGNOSIS — Z961 Presence of intraocular lens: Secondary | ICD-10-CM | POA: Insufficient documentation

## 2020-01-23 DIAGNOSIS — R0602 Shortness of breath: Secondary | ICD-10-CM | POA: Diagnosis not present

## 2020-01-23 DIAGNOSIS — H02831 Dermatochalasis of right upper eyelid: Secondary | ICD-10-CM | POA: Diagnosis not present

## 2020-01-23 HISTORY — PX: CATARACT EXTRACTION W/PHACO: SHX586

## 2020-01-23 SURGERY — PHACOEMULSIFICATION, CATARACT, WITH IOL INSERTION
Anesthesia: Monitor Anesthesia Care | Site: Eye | Laterality: Right

## 2020-01-23 MED ORDER — LIDOCAINE HCL 3.5 % OP GEL
1.0000 "application " | Freq: Once | OPHTHALMIC | Status: AC
  Administered 2020-01-23: 1 via OPHTHALMIC

## 2020-01-23 MED ORDER — CYCLOPENTOLATE-PHENYLEPHRINE 0.2-1 % OP SOLN
1.0000 [drp] | OPHTHALMIC | Status: AC | PRN
  Administered 2020-01-23 (×3): 1 [drp] via OPHTHALMIC

## 2020-01-23 MED ORDER — EPINEPHRINE PF 1 MG/ML IJ SOLN
INTRAOCULAR | Status: DC | PRN
  Administered 2020-01-23: 500 mL

## 2020-01-23 MED ORDER — POVIDONE-IODINE 5 % OP SOLN
OPHTHALMIC | Status: DC | PRN
  Administered 2020-01-23: 1 via OPHTHALMIC

## 2020-01-23 MED ORDER — PROVISC 10 MG/ML IO SOLN
INTRAOCULAR | Status: DC | PRN
  Administered 2020-01-23: 0.85 mL via INTRAOCULAR

## 2020-01-23 MED ORDER — NEOMYCIN-POLYMYXIN-DEXAMETH 3.5-10000-0.1 OP SUSP
OPHTHALMIC | Status: DC | PRN
  Administered 2020-01-23: 1 [drp] via OPHTHALMIC

## 2020-01-23 MED ORDER — BSS IO SOLN
INTRAOCULAR | Status: DC | PRN
  Administered 2020-01-23: 15 mL via INTRAOCULAR

## 2020-01-23 MED ORDER — EPINEPHRINE PF 1 MG/ML IJ SOLN
INTRAMUSCULAR | Status: AC
  Filled 2020-01-23: qty 2

## 2020-01-23 MED ORDER — SODIUM HYALURONATE 23 MG/ML IO SOLN
INTRAOCULAR | Status: DC | PRN
  Administered 2020-01-23: 0.6 mL via INTRAOCULAR

## 2020-01-23 MED ORDER — LIDOCAINE HCL (PF) 1 % IJ SOLN
INTRAOCULAR | Status: DC | PRN
  Administered 2020-01-23: 1 mL via OPHTHALMIC

## 2020-01-23 MED ORDER — TETRACAINE HCL 0.5 % OP SOLN
1.0000 [drp] | OPHTHALMIC | Status: AC | PRN
  Administered 2020-01-23 (×3): 1 [drp] via OPHTHALMIC

## 2020-01-23 MED ORDER — PHENYLEPHRINE HCL 2.5 % OP SOLN
1.0000 [drp] | OPHTHALMIC | Status: AC | PRN
  Administered 2020-01-23 (×3): 1 [drp] via OPHTHALMIC

## 2020-01-23 SURGICAL SUPPLY — 12 items
CLOTH BEACON ORANGE TIMEOUT ST (SAFETY) ×2 IMPLANT
EYE SHIELD UNIVERSAL CLEAR (GAUZE/BANDAGES/DRESSINGS) ×2 IMPLANT
GLOVE BIOGEL PI IND STRL 7.0 (GLOVE) ×2 IMPLANT
GLOVE BIOGEL PI INDICATOR 7.0 (GLOVE) ×2
LENS ALC ACRYL/TECN (Ophthalmic Related) ×2 IMPLANT
NEEDLE HYPO 18GX1.5 BLUNT FILL (NEEDLE) ×2 IMPLANT
PAD ARMBOARD 7.5X6 YLW CONV (MISCELLANEOUS) ×2 IMPLANT
SYR TB 1ML LL NO SAFETY (SYRINGE) ×2 IMPLANT
TAPE SURG TRANSPORE 1 IN (GAUZE/BANDAGES/DRESSINGS) ×1 IMPLANT
TAPE SURGICAL TRANSPORE 1 IN (GAUZE/BANDAGES/DRESSINGS) ×1
VISCOELASTIC ADDITIONAL (OPHTHALMIC RELATED) ×2 IMPLANT
WATER STERILE IRR 250ML POUR (IV SOLUTION) ×2 IMPLANT

## 2020-01-23 NOTE — Progress Notes (Addendum)
Dr Wyatt Haste viewed EKG  rhythm strip bigeminy PVC's

## 2020-01-23 NOTE — Anesthesia Procedure Notes (Signed)
Procedure Name: MAC Date/Time: 01/23/2020 9:08 AM Performed by: Orlie Dakin, CRNA Pre-anesthesia Checklist: Patient identified, Emergency Drugs available, Suction available and Patient being monitored Oxygen Delivery Method: Nasal cannula Placement Confirmation: positive ETCO2

## 2020-01-23 NOTE — Discharge Instructions (Signed)
Please discharge patient when stable, will follow up today with Dr. Shayan Bramhall at the [facility] Day office immediately following discharge.  Leave shield in place until visit.  All paperwork with discharge instructions will be given at the office.  [facility] Address:  [address]  [place], [place] [address]  

## 2020-01-23 NOTE — Anesthesia Preprocedure Evaluation (Addendum)
Anesthesia Evaluation  Patient identified by MRN, date of birth, ID band Patient awake    Reviewed: Allergy & Precautions, NPO status , Patient's Chart, lab work & pertinent test results, reviewed documented beta blocker date and time   History of Anesthesia Complications Negative for: history of anesthetic complications  Airway Mallampati: I  TM Distance: >3 FB Neck ROM: Full   Comment: Cleft palate repair  Dental  (+) Missing, Chipped, Dental Advisory Given, Poor Dentition   Pulmonary shortness of breath and with exertion,    Pulmonary exam normal breath sounds clear to auscultation       Cardiovascular Exercise Tolerance: Good METS: 3 - Mets hypertension, Pt. on medications and Pt. on home beta blockers + CAD and + CABG (2010)  Normal cardiovascular exam Rhythm:Regular Rate:Normal - Systolic murmurs, - Diastolic murmurs, - Friction Rub, - Carotid Bruit, - Peripheral Edema and - Systolic Click [id number] 65:78:46 [facility] ROUTINE RECORD Normal sinus rhythm Normal ECG Confirmed by Loralie Champagne ([id number]) on 01/07/2020 4:34:03 PM   Neuro/Psych negative neurological ROS  negative psych ROS   GI/Hepatic negative GI ROS, Neg liver ROS,   Endo/Other  negative endocrine ROS  Renal/GU negative Renal ROS     Musculoskeletal negative musculoskeletal ROS (+)   Abdominal   Peds  Hematology negative hematology ROS (+)   Anesthesia Other Findings Cleft palate repair  Reproductive/Obstetrics negative OB ROS                             Anesthesia Physical  Anesthesia Plan  ASA: III  Anesthesia Plan: MAC   Post-op Pain Management:    Induction: Intravenous  PONV Risk Score and Plan:   Airway Management Planned: Nasal Cannula and Natural Airway  Additional Equipment:   Intra-op Plan:   Post-operative Plan:   Informed Consent: I have reviewed the patients History  and Physical, chart, labs and discussed the procedure including the risks, benefits and alternatives for the proposed anesthesia with the patient or authorized representative who has indicated his/her understanding and acceptance.     Dental advisory given  Plan Discussed with: CRNA and Surgeon  Anesthesia Plan Comments:         Anesthesia Quick Evaluation

## 2020-01-23 NOTE — Op Note (Signed)
Date of procedure: 01/23/20  Pre-operative diagnosis:  Visually significant combined form age-related cataract, Right Eye (H25.811)  Post-operative diagnosis:  Visually significant combined form age-related cataract, Right Eye (H25.811)  Procedure: Removal of cataract via phacoemulsification and insertion of intra-ocular lens [employer] [id number]  +22.0D into the capsular bag of the Right Eye  Attending surgeon: Adonay Scheier A. Raiford Fetterman, MD, MA  Anesthesia: MAC, Topical Akten  Complications: None  Estimated Blood Loss: <1mL (minimal)  Specimens: None  Implants: As above  Indications:  Visually significant age-related cataract, Right Eye  Procedure:  The patient was seen and identified in the pre-operative area. The operative eye was identified and dilated.  The operative eye was marked.  Topical anesthesia was administered to the operative eye.     The patient was then to the operative suite and placed in the supine position.  A timeout was performed confirming the patient, procedure to be performed, and all other relevant information.   The patient's face was prepped and draped in the usual fashion for intra-ocular surgery.  A lid speculum was placed into the operative eye and the surgical microscope moved into place and focused.  A superotemporal paracentesis was created using a 20 gauge paracentesis blade.  Shugarcaine was injected into the anterior chamber.  Viscoelastic was injected into the anterior chamber.  A temporal clear-corneal main wound incision was created using a 2.4mm microkeratome.  A continuous curvilinear capsulorrhexis was initiated using an irrigating cystitome and completed using capsulorrhexis forceps.  Hydrodissection and hydrodeliniation were performed.  Viscoelastic was injected into the anterior chamber.  A phacoemulsification handpiece and a chopper as a second instrument were used to remove the nucleus and epinucleus. The irrigation/aspiration handpiece was  used to remove any remaining cortical material.   The capsular bag was reinflated with viscoelastic, checked, and found to be intact.  The intraocular lens was inserted into the capsular bag.  The irrigation/aspiration handpiece was used to remove any remaining viscoelastic.  The clear corneal wound and paracentesis wounds were then hydrated and checked with Weck-Cels to be watertight.  The lid-speculum was removed.  The drape was removed.  The patient's face was cleaned with a wet and dry 4x4.   Maxitrol was instilled in the eye. A clear shield was taped over the eye. The patient was taken to the post-operative care unit in good condition, having tolerated the procedure well.  Post-Op Instructions: The patient will follow up at [facility] for a same day post-operative evaluation and will receive all other orders and instructions.  

## 2020-01-23 NOTE — Transfer of Care (Signed)
Immediate Anesthesia Transfer of Care Note  Patient: Kyle Boyd  Procedure(s) Performed: CATARACT EXTRACTION PHACO AND INTRAOCULAR LENS PLACEMENT RIGHT EYE (Right Eye)  Patient Location: Short Stay  Anesthesia Type:MAC  Level of Consciousness: awake, alert  and oriented  Airway & Oxygen Therapy: Patient Spontanous Breathing  Post-op Assessment: Report given to RN, Post -op Vital signs reviewed and stable and Patient moving all extremities X 4  Post vital signs: Reviewed and stable  Last Vitals:  Vitals Value Taken Time  BP    Temp    Pulse    Resp    SpO2      Last Pain:  Vitals:   01/23/20 0837  TempSrc: Oral  PainSc: 0-No pain      Patients Stated Pain Goal: 5 (38/10/17 5102)  Complications: No complications documented.

## 2020-01-23 NOTE — Interval H&P Note (Signed)
History and Physical Interval Note:  01/23/2020 8:53 AM  Kyle Boyd  has presented today for surgery, with the diagnosis of Nuclear sclerotic cataract - Right eye.  The various methods of treatment have been discussed with the patient and family. After consideration of risks, benefits and other options for treatment, the patient has consented to  Procedure(s) with comments: CATARACT EXTRACTION PHACO AND INTRAOCULAR LENS PLACEMENT (IOC) (Right) - CDE:  as a surgical intervention.  The patient's history has been reviewed, patient examined, no change in status, stable for surgery.  I have reviewed the patient's chart and labs.  Questions were answered to the patient's satisfaction.     Baruch Goldmann

## 2020-01-23 NOTE — Anesthesia Postprocedure Evaluation (Signed)
Anesthesia Post Note  Patient: Kyle Boyd  Procedure(s) Performed: CATARACT EXTRACTION PHACO AND INTRAOCULAR LENS PLACEMENT RIGHT EYE (Right Eye)  Patient location during evaluation: Short Stay Anesthesia Type: MAC Level of consciousness: awake and alert and oriented Pain management: pain level controlled Vital Signs Assessment: post-procedure vital signs reviewed and stable Respiratory status: spontaneous breathing Cardiovascular status: blood pressure returned to baseline Postop Assessment: no headache and no apparent nausea or vomiting Anesthetic complications: no   No complications documented.   Last Vitals:  Vitals:   01/23/20 0837 01/23/20 0919  BP: 108/66 125/69  Pulse: 89 (!) 59  Resp: (!) 26 20  Temp: 36.5 C 36.8 C  SpO2: 96% 96%    Last Pain:  Vitals:   01/23/20 0919  TempSrc: Oral  PainSc: 0-No pain                 Orlie Dakin

## 2020-01-26 ENCOUNTER — Encounter (HOSPITAL_COMMUNITY): Payer: Self-pay | Admitting: Ophthalmology

## 2020-01-27 DIAGNOSIS — Z6829 Body mass index (BMI) 29.0-29.9, adult: Secondary | ICD-10-CM | POA: Diagnosis not present

## 2020-01-27 DIAGNOSIS — H9202 Otalgia, left ear: Secondary | ICD-10-CM | POA: Diagnosis not present

## 2020-01-29 ENCOUNTER — Ambulatory Visit: Payer: Medicare HMO | Admitting: Cardiology

## 2020-01-29 ENCOUNTER — Encounter: Payer: Self-pay | Admitting: Cardiology

## 2020-01-29 VITALS — BP 110/72 | HR 68 | Ht 65.0 in | Wt 172.0 lb

## 2020-01-29 DIAGNOSIS — E782 Mixed hyperlipidemia: Secondary | ICD-10-CM

## 2020-01-29 DIAGNOSIS — I25119 Atherosclerotic heart disease of native coronary artery with unspecified angina pectoris: Secondary | ICD-10-CM | POA: Diagnosis not present

## 2020-01-29 MED ORDER — NITROGLYCERIN 0.4 MG SL SUBL: 0.4000 mg | SUBLINGUAL_TABLET | SUBLINGUAL | 3 refills | Status: DC | PRN

## 2020-01-29 NOTE — Progress Notes (Signed)
Cardiology Office Note  Date: 01/29/2020   ID: Kyle Boyd, Kyle Boyd Jun 20, 1950, MRN [id number]  PCP:  Manon Hilding, MD  Cardiologist:  Rozann Lesches, MD Electrophysiologist:  None   Chief Complaint  Patient presents with   Cardiac follow-up    History of Present Illness: Kyle Boyd is a 69 y.o. male last assessed via telehealth encounter in March.  He presents for a routine follow-up visit.  He states that he continues to walk 3 miles a day for exercise, sometimes feels fatigued afterwards but not reporting any obvious angina or limiting shortness of breath.  I reviewed his medications which are outlined below.  He does not report any recent nitroglycerin use.  He plans to see [name] next month with lab work and a physical.  I personally reviewed his ECG today which shows normal sinus rhythm.  Past Medical History:  Diagnosis Date   CAD (coronary artery disease)    Multivessel status post CABG 2010   Carotid artery disease [facility])    Erectile dysfunction    Essential hypertension     Past Surgical History:  Procedure Laterality Date   CATARACT EXTRACTION W/PHACO Left 01/09/2020   Procedure: CATARACT EXTRACTION PHACO AND INTRAOCULAR LENS PLACEMENT ([place]);  Surgeon: Baruch Goldmann, MD;  Location: [facility]S;  Service: Ophthalmology;  Laterality: Left;  CDE: 6.99   CATARACT EXTRACTION W/PHACO Right 01/23/2020   Procedure: CATARACT EXTRACTION PHACO AND INTRAOCULAR LENS PLACEMENT RIGHT EYE;  Surgeon: Baruch Goldmann, MD;  Location: [facility] ORS;  Service: Ophthalmology;  Laterality: Right;  CDE: 6.11   CLEFT PALATE REPAIR     CORONARY ARTERY BYPASS GRAFT  October 2010   Dr. [name] - LIMA to LAD, SVG to diagonal, SVG to OM, SVG to RCA    Current Outpatient Medications  Medication Sig Dispense Refill   acetaminophen (TYLENOL) 500 MG tablet Take 500 mg by mouth every 6 (six) hours as needed for moderate pain.      aspirin EC 81 MG tablet Take 1 tablet (81 mg total) by  mouth daily. 90 tablet 3   lisinopril (PRINIVIL,ZESTRIL) 5 MG tablet Take 5 mg by mouth daily.      metoprolol tartrate (LOPRESSOR) 25 MG tablet Take 1 tablet (25 mg total) by mouth 2 (two) times daily. 60 tablet 6   nitroGLYCERIN (NITROSTAT) 0.4 MG SL tablet Place 1 tablet (0.4 mg total) under the tongue every 5 (five) minutes x 3 doses as needed. If no relief after 3rd dose, proceed to the ED for an evaluation 25 tablet 3   simvastatin (ZOCOR) 40 MG tablet Take 40 mg by mouth at bedtime.       No current facility-administered medications for this visit.   Allergies:  Patient has no known allergies.   ROS:  Reports recent cataract surgery.  Physical Exam: VS:  BP 110/72    Pulse 68    Ht 5\' 5"  (1.651 m)    Wt 172 lb (78 kg)    SpO2 94%    BMI 28.62 kg/m , BMI Body mass index is 28.62 kg/m.  Wt Readings from Last 3 Encounters:  01/29/20 172 lb (78 kg)  01/07/20 175 lb (79.4 kg)  07/22/19 180 lb (81.6 kg)    General: Patient appears comfortable at rest. HEENT: Conjunctiva and lids normal, oropharynx clear with moist mucosa. Neck: Supple, no elevated JVP or carotid bruits, no thyromegaly. Lungs: Clear to auscultation, nonlabored breathing at rest. Cardiac: Regular rate and rhythm, no S3, 2/6  systolic murmur. Extremities: No pitting edema, distal pulses 2+.  ECG:  An ECG dated 09/26/2017 was personally reviewed today and demonstrated:  Sinus bradycardia with borderline low voltage.  Recent Labwork:  June 2020: Cholesterol 141, TG 252, HDL 27, LDL 64, BUN 12, creatinine 0.95, potassium 4.4, AST 48, ALT 43 June 2021: BUN 20, creatinine 1.12, potassium 4.2  Other Studies Reviewed Today:  No interval cardiac testing for review today.  Assessment and Plan:  1.  CAD status post CABG in 2010.  He has preferred observation over time, no angina symptoms and walking 3 miles a day.  ECG is normal.  I have talked with him about follow-up ischemic testing if symptoms intervene.  Continue  aspirin, lisinopril, Lopressor, Zocor, refill provided for fresh bottle of nitroglycerin.  2.  Mixed hyperlipidemia on Zocor.  He is due for follow-up lab with [name], last LDL 64.  Medication Adjustments/Labs and Tests Ordered: Current medicines are reviewed at length with the patient today.  Concerns regarding medicines are outlined above.   Tests Ordered: Orders Placed This Encounter  Procedures   EKG 12-Lead    Medication Changes: Meds ordered this encounter  Medications   nitroGLYCERIN (NITROSTAT) 0.4 MG SL tablet    Sig: Place 1 tablet (0.4 mg total) under the tongue every 5 (five) minutes x 3 doses as needed. If no relief after 3rd dose, proceed to the ED for an evaluation    Dispense:  25 tablet    Refill:  3    Disposition:  Follow up 6 months in the [place] office.  Signed, Satira Sark, MD, [facility] 01/29/2020 2:38 PM    [facility] at [place], [place], [place] [address] Phone: [phone]; Fax: [phone]

## 2020-01-29 NOTE — Patient Instructions (Signed)

## 2020-02-05 DIAGNOSIS — N401 Enlarged prostate with lower urinary tract symptoms: Secondary | ICD-10-CM | POA: Diagnosis not present

## 2020-02-05 DIAGNOSIS — N138 Other obstructive and reflux uropathy: Secondary | ICD-10-CM | POA: Diagnosis not present

## 2020-02-05 DIAGNOSIS — I10 Essential (primary) hypertension: Secondary | ICD-10-CM | POA: Diagnosis not present

## 2020-02-05 DIAGNOSIS — E7849 Other hyperlipidemia: Secondary | ICD-10-CM | POA: Diagnosis not present

## 2020-02-09 DIAGNOSIS — R739 Hyperglycemia, unspecified: Secondary | ICD-10-CM | POA: Diagnosis not present

## 2020-02-09 DIAGNOSIS — E782 Mixed hyperlipidemia: Secondary | ICD-10-CM | POA: Diagnosis not present

## 2020-02-09 DIAGNOSIS — E7800 Pure hypercholesterolemia, unspecified: Secondary | ICD-10-CM | POA: Diagnosis not present

## 2020-02-09 DIAGNOSIS — E7801 Familial hypercholesterolemia: Secondary | ICD-10-CM | POA: Diagnosis not present

## 2020-02-09 DIAGNOSIS — I10 Essential (primary) hypertension: Secondary | ICD-10-CM | POA: Diagnosis not present

## 2020-02-09 DIAGNOSIS — K7581 Nonalcoholic steatohepatitis (NASH): Secondary | ICD-10-CM | POA: Diagnosis not present

## 2020-02-09 DIAGNOSIS — E8881 Metabolic syndrome: Secondary | ICD-10-CM | POA: Diagnosis not present

## 2020-02-09 DIAGNOSIS — I259 Chronic ischemic heart disease, unspecified: Secondary | ICD-10-CM | POA: Diagnosis not present

## 2020-02-12 DIAGNOSIS — I10 Essential (primary) hypertension: Secondary | ICD-10-CM | POA: Diagnosis not present

## 2020-02-12 DIAGNOSIS — Z86010 Personal history of colonic polyps: Secondary | ICD-10-CM | POA: Diagnosis not present

## 2020-02-12 DIAGNOSIS — Z6828 Body mass index (BMI) 28.0-28.9, adult: Secondary | ICD-10-CM | POA: Diagnosis not present

## 2020-02-12 DIAGNOSIS — N401 Enlarged prostate with lower urinary tract symptoms: Secondary | ICD-10-CM | POA: Diagnosis not present

## 2020-02-12 DIAGNOSIS — Z0001 Encounter for general adult medical examination with abnormal findings: Secondary | ICD-10-CM | POA: Diagnosis not present

## 2020-02-12 DIAGNOSIS — E781 Pure hyperglyceridemia: Secondary | ICD-10-CM | POA: Diagnosis not present

## 2020-02-12 DIAGNOSIS — E8881 Metabolic syndrome: Secondary | ICD-10-CM | POA: Diagnosis not present

## 2020-02-12 DIAGNOSIS — Z23 Encounter for immunization: Secondary | ICD-10-CM | POA: Diagnosis not present

## 2020-03-06 DIAGNOSIS — N138 Other obstructive and reflux uropathy: Secondary | ICD-10-CM | POA: Diagnosis not present

## 2020-03-06 DIAGNOSIS — N401 Enlarged prostate with lower urinary tract symptoms: Secondary | ICD-10-CM | POA: Diagnosis not present

## 2020-03-06 DIAGNOSIS — E7849 Other hyperlipidemia: Secondary | ICD-10-CM | POA: Diagnosis not present

## 2020-03-06 DIAGNOSIS — I10 Essential (primary) hypertension: Secondary | ICD-10-CM | POA: Diagnosis not present

## 2020-03-10 DIAGNOSIS — Z23 Encounter for immunization: Secondary | ICD-10-CM | POA: Diagnosis not present

## 2020-03-24 DIAGNOSIS — Z0100 Encounter for examination of eyes and vision without abnormal findings: Secondary | ICD-10-CM | POA: Diagnosis not present

## 2020-05-07 DIAGNOSIS — N401 Enlarged prostate with lower urinary tract symptoms: Secondary | ICD-10-CM | POA: Diagnosis not present

## 2020-05-07 DIAGNOSIS — N138 Other obstructive and reflux uropathy: Secondary | ICD-10-CM | POA: Diagnosis not present

## 2020-05-07 DIAGNOSIS — E7849 Other hyperlipidemia: Secondary | ICD-10-CM | POA: Diagnosis not present

## 2020-05-07 DIAGNOSIS — I10 Essential (primary) hypertension: Secondary | ICD-10-CM | POA: Diagnosis not present

## 2020-06-18 DIAGNOSIS — H43811 Vitreous degeneration, right eye: Secondary | ICD-10-CM | POA: Diagnosis not present

## 2020-07-16 DIAGNOSIS — H43811 Vitreous degeneration, right eye: Secondary | ICD-10-CM | POA: Diagnosis not present

## 2020-07-28 ENCOUNTER — Encounter: Payer: Self-pay | Admitting: Cardiology

## 2020-07-28 ENCOUNTER — Ambulatory Visit: Payer: Medicare HMO | Admitting: Cardiology

## 2020-07-28 VITALS — BP 118/56 | HR 68 | Wt 171.0 lb

## 2020-07-28 DIAGNOSIS — I10 Essential (primary) hypertension: Secondary | ICD-10-CM

## 2020-07-28 DIAGNOSIS — I25119 Atherosclerotic heart disease of native coronary artery with unspecified angina pectoris: Secondary | ICD-10-CM | POA: Diagnosis not present

## 2020-07-28 DIAGNOSIS — R011 Cardiac murmur, unspecified: Secondary | ICD-10-CM | POA: Diagnosis not present

## 2020-07-28 NOTE — Progress Notes (Signed)
Cardiology Office Note  Date: 07/28/2020   ID: Kyle Boyd, Kyle Boyd May 10, 1950, MRN [id number]  PCP:  Manon Hilding, MD  Cardiologist:  Rozann Lesches, MD Electrophysiologist:  None   Chief Complaint  Patient presents with   Cardiac follow-up    History of Present Illness: Kyle Boyd is a 70 y.o. male last seen in September 2021.  He presents for a routine visit.  Reports no active angina at this time, still enjoys walking regularly at [employer] in [place].  He walks the loop for 3-1/2 miles, sometimes twice a day weather permitting.  I reviewed his medications, he reports compliance, no nitroglycerin use.  He continues to follow with [name], has a follow-up visit scheduled in April.  We discussed getting a follow-up echocardiogram for reassessment of cardiac murmur and valvular status.  He continues to prefer to hold off on stress testing, reasonable with his current functional capacity on medical therapy.  Past Medical History:  Diagnosis Date   CAD (coronary artery disease)    Multivessel status post CABG 2010   Carotid artery disease [facility])    Erectile dysfunction    Essential hypertension     Past Surgical History:  Procedure Laterality Date   CATARACT EXTRACTION W/PHACO Left 01/09/2020   Procedure: CATARACT EXTRACTION PHACO AND INTRAOCULAR LENS PLACEMENT ([place]);  Surgeon: Baruch Goldmann, MD;  Location: [facility];  Service: Ophthalmology;  Laterality: Left;  CDE: 6.99   CATARACT EXTRACTION W/PHACO Right 01/23/2020   Procedure: CATARACT EXTRACTION PHACO AND INTRAOCULAR LENS PLACEMENT RIGHT EYE;  Surgeon: Baruch Goldmann, MD;  Location: [facility];  Service: Ophthalmology;  Laterality: Right;  CDE: 6.11   CLEFT PALATE REPAIR     CORONARY ARTERY BYPASS GRAFT  October 2010   Dr. [name] - LIMA to LAD, SVG to diagonal, SVG to OM, SVG to RCA    Current Outpatient Medications  Medication Sig Dispense Refill   acetaminophen (TYLENOL) 500 MG tablet Take 500 mg by mouth  every 6 (six) hours as needed for moderate pain.     aspirin EC 81 MG tablet Take 1 tablet (81 mg total) by mouth daily. 90 tablet 3   lisinopril (PRINIVIL,ZESTRIL) 5 MG tablet Take 5 mg by mouth daily.      metoprolol tartrate (LOPRESSOR) 25 MG tablet Take 1 tablet (25 mg total) by mouth 2 (two) times daily. 60 tablet 6   nitroGLYCERIN (NITROSTAT) 0.4 MG SL tablet Place 1 tablet (0.4 mg total) under the tongue every 5 (five) minutes x 3 doses as needed. If no relief after 3rd dose, proceed to the ED for an evaluation 25 tablet 3   simvastatin (ZOCOR) 40 MG tablet Take 40 mg by mouth at bedtime.     No current facility-administered medications for this visit.   Allergies:  Patient has no known allergies.   ROS: No dizziness or syncope.  Physical Exam: VS:  BP (!) 118/56 (BP Location: Right Arm, Patient Position: Sitting, Cuff Size: Normal)    Pulse 68    Wt 171 lb (77.6 kg)    SpO2 96%    BMI 28.46 kg/m , BMI Body mass index is 28.46 kg/m.  Wt Readings from Last 3 Encounters:  07/28/20 171 lb (77.6 kg)  01/29/20 172 lb (78 kg)  01/07/20 175 lb (79.4 kg)    General: Patient appears comfortable at rest. HEENT: Conjunctiva and lids normal, wearing a mask. Neck: Supple, no elevated JVP or carotid bruits, no thyromegaly. Lungs: Clear to auscultation,  nonlabored breathing at rest. Cardiac: Regular rate and rhythm, no S3, 3/6 systolic murmur, no pericardial rub. Extremities: No pitting edema.  ECG:  An ECG dated 01/29/2020 was personally reviewed today and demonstrated:  Sinus rhythm.  Recent Labwork:  No interval lab work for review today.  Other Studies Reviewed Today:  No interval cardiac testing for review today.  Assessment and Plan:  1.  CAD status post CABG in 2010.  He reports good functional capacity without progressive angina or nitroglycerin use.  Continue aspirin, Lopressor, lisinopril, Zocor, and as needed nitroglycerin.  He has preferred to hold off on follow-up  ischemic testing unless symptoms intervene.  2.  Cardiac murmur suggestive of aortic stenosis, I talked with him about getting an echocardiogram and he is in agreement.  3.  Essential hypertension, blood pressure is well controlled today.  No changes made in his medications.  Medication Adjustments/Labs and Tests Ordered: Current medicines are reviewed at length with the patient today.  Concerns regarding medicines are outlined above.   Tests Ordered: Orders Placed This Encounter  Procedures   ECHOCARDIOGRAM COMPLETE    Medication Changes: No orders of the defined types were placed in this encounter.   Disposition:  Follow up 6 months in the [place] office.  Signed, Satira Sark, MD, [facility] 07/28/2020 4:15 PM    [facility] at [place], [place], [place] [address] Phone: [phone]; Fax: [phone]

## 2020-07-28 NOTE — Patient Instructions (Addendum)

## 2020-08-06 DIAGNOSIS — E7849 Other hyperlipidemia: Secondary | ICD-10-CM | POA: Diagnosis not present

## 2020-08-06 DIAGNOSIS — E7801 Familial hypercholesterolemia: Secondary | ICD-10-CM | POA: Diagnosis not present

## 2020-08-06 DIAGNOSIS — E7800 Pure hypercholesterolemia, unspecified: Secondary | ICD-10-CM | POA: Diagnosis not present

## 2020-08-06 DIAGNOSIS — Z1159 Encounter for screening for other viral diseases: Secondary | ICD-10-CM | POA: Diagnosis not present

## 2020-08-06 DIAGNOSIS — E8881 Metabolic syndrome: Secondary | ICD-10-CM | POA: Diagnosis not present

## 2020-08-06 DIAGNOSIS — R739 Hyperglycemia, unspecified: Secondary | ICD-10-CM | POA: Diagnosis not present

## 2020-08-06 DIAGNOSIS — E782 Mixed hyperlipidemia: Secondary | ICD-10-CM | POA: Diagnosis not present

## 2020-08-11 DIAGNOSIS — I259 Chronic ischemic heart disease, unspecified: Secondary | ICD-10-CM | POA: Diagnosis not present

## 2020-08-11 DIAGNOSIS — E8881 Metabolic syndrome: Secondary | ICD-10-CM | POA: Diagnosis not present

## 2020-08-11 DIAGNOSIS — I10 Essential (primary) hypertension: Secondary | ICD-10-CM | POA: Diagnosis not present

## 2020-08-11 DIAGNOSIS — Z1389 Encounter for screening for other disorder: Secondary | ICD-10-CM | POA: Diagnosis not present

## 2020-08-11 DIAGNOSIS — E781 Pure hyperglyceridemia: Secondary | ICD-10-CM | POA: Diagnosis not present

## 2020-08-11 DIAGNOSIS — N401 Enlarged prostate with lower urinary tract symptoms: Secondary | ICD-10-CM | POA: Diagnosis not present

## 2020-08-11 DIAGNOSIS — Z1331 Encounter for screening for depression: Secondary | ICD-10-CM | POA: Diagnosis not present

## 2020-08-11 DIAGNOSIS — Z86010 Personal history of colonic polyps: Secondary | ICD-10-CM | POA: Diagnosis not present

## 2020-08-17 ENCOUNTER — Ambulatory Visit (INDEPENDENT_AMBULATORY_CARE_PROVIDER_SITE_OTHER): Payer: Medicare HMO

## 2020-08-17 DIAGNOSIS — I25119 Atherosclerotic heart disease of native coronary artery with unspecified angina pectoris: Secondary | ICD-10-CM | POA: Diagnosis not present

## 2020-08-17 DIAGNOSIS — R011 Cardiac murmur, unspecified: Secondary | ICD-10-CM

## 2020-08-17 LAB — ECHOCARDIOGRAM COMPLETE
AR max vel: 1.72 cm2
AV Area VTI: 1.75 cm2
AV Area mean vel: 1.74 cm2
AV Mean grad: 6.5 mmHg
AV Peak grad: 12.5 mmHg
Ao pk vel: 1.76 m/s
Area-P 1/2: 3.63 cm2
Calc EF: 65.4 %
MV M vel: 1.34 m/s
MV Peak grad: 7.2 mmHg
S' Lateral: 2.56 cm
Single Plane A2C EF: 70.8 %
Single Plane A4C EF: 60.9 %

## 2020-08-19 ENCOUNTER — Telehealth: Payer: Self-pay | Admitting: *Deleted

## 2020-08-19 NOTE — Telephone Encounter (Signed)
-----   Message from Satira Sark, MD sent at 08/17/2020  2:27 PM EDT ----- Results reviewed. LVEF normal at 60-65%. Aortic valve is mildly thickened and calcified; but not clearly stenotic. No change in medications. Keep 6 month follow-up.

## 2020-08-31 ENCOUNTER — Encounter: Payer: Self-pay | Admitting: *Deleted

## 2020-08-31 NOTE — Telephone Encounter (Signed)
Letter mailed with result. Copy sent to PCP

## 2020-09-04 DIAGNOSIS — N401 Enlarged prostate with lower urinary tract symptoms: Secondary | ICD-10-CM | POA: Diagnosis not present

## 2020-09-04 DIAGNOSIS — I10 Essential (primary) hypertension: Secondary | ICD-10-CM | POA: Diagnosis not present

## 2020-09-04 DIAGNOSIS — E7849 Other hyperlipidemia: Secondary | ICD-10-CM | POA: Diagnosis not present

## 2020-09-04 DIAGNOSIS — N138 Other obstructive and reflux uropathy: Secondary | ICD-10-CM | POA: Diagnosis not present

## 2020-10-04 DIAGNOSIS — N138 Other obstructive and reflux uropathy: Secondary | ICD-10-CM | POA: Diagnosis not present

## 2020-10-04 DIAGNOSIS — E7849 Other hyperlipidemia: Secondary | ICD-10-CM | POA: Diagnosis not present

## 2020-10-04 DIAGNOSIS — I10 Essential (primary) hypertension: Secondary | ICD-10-CM | POA: Diagnosis not present

## 2020-10-04 DIAGNOSIS — N401 Enlarged prostate with lower urinary tract symptoms: Secondary | ICD-10-CM | POA: Diagnosis not present

## 2020-11-04 DIAGNOSIS — I10 Essential (primary) hypertension: Secondary | ICD-10-CM | POA: Diagnosis not present

## 2020-11-04 DIAGNOSIS — N138 Other obstructive and reflux uropathy: Secondary | ICD-10-CM | POA: Diagnosis not present

## 2020-11-04 DIAGNOSIS — E7849 Other hyperlipidemia: Secondary | ICD-10-CM | POA: Diagnosis not present

## 2020-11-04 DIAGNOSIS — N401 Enlarged prostate with lower urinary tract symptoms: Secondary | ICD-10-CM | POA: Diagnosis not present

## 2020-12-02 DIAGNOSIS — Z20828 Contact with and (suspected) exposure to other viral communicable diseases: Secondary | ICD-10-CM | POA: Diagnosis not present

## 2020-12-02 DIAGNOSIS — R69 Illness, unspecified: Secondary | ICD-10-CM | POA: Diagnosis not present

## 2020-12-02 DIAGNOSIS — H6092 Unspecified otitis externa, left ear: Secondary | ICD-10-CM | POA: Diagnosis not present

## 2020-12-03 DIAGNOSIS — D485 Neoplasm of uncertain behavior of skin: Secondary | ICD-10-CM | POA: Diagnosis not present

## 2020-12-03 DIAGNOSIS — R69 Illness, unspecified: Secondary | ICD-10-CM | POA: Diagnosis not present

## 2020-12-03 DIAGNOSIS — Z6829 Body mass index (BMI) 29.0-29.9, adult: Secondary | ICD-10-CM | POA: Diagnosis not present

## 2020-12-03 DIAGNOSIS — C4441 Basal cell carcinoma of skin of scalp and neck: Secondary | ICD-10-CM | POA: Diagnosis not present

## 2020-12-05 DIAGNOSIS — I10 Essential (primary) hypertension: Secondary | ICD-10-CM | POA: Diagnosis not present

## 2020-12-05 DIAGNOSIS — I2510 Atherosclerotic heart disease of native coronary artery without angina pectoris: Secondary | ICD-10-CM | POA: Diagnosis not present

## 2020-12-05 DIAGNOSIS — Z87442 Personal history of urinary calculi: Secondary | ICD-10-CM | POA: Diagnosis not present

## 2020-12-05 DIAGNOSIS — Z7982 Long term (current) use of aspirin: Secondary | ICD-10-CM | POA: Diagnosis not present

## 2020-12-05 DIAGNOSIS — E785 Hyperlipidemia, unspecified: Secondary | ICD-10-CM | POA: Diagnosis not present

## 2020-12-05 DIAGNOSIS — Z23 Encounter for immunization: Secondary | ICD-10-CM | POA: Diagnosis not present

## 2020-12-05 DIAGNOSIS — W268XXA Contact with other sharp object(s), not elsewhere classified, initial encounter: Secondary | ICD-10-CM | POA: Diagnosis not present

## 2020-12-05 DIAGNOSIS — S61211A Laceration without foreign body of left index finger without damage to nail, initial encounter: Secondary | ICD-10-CM | POA: Diagnosis not present

## 2020-12-05 DIAGNOSIS — Z79899 Other long term (current) drug therapy: Secondary | ICD-10-CM | POA: Diagnosis not present

## 2020-12-28 DIAGNOSIS — C4441 Basal cell carcinoma of skin of scalp and neck: Secondary | ICD-10-CM | POA: Diagnosis not present

## 2020-12-28 DIAGNOSIS — L988 Other specified disorders of the skin and subcutaneous tissue: Secondary | ICD-10-CM | POA: Diagnosis not present

## 2021-01-05 DIAGNOSIS — E7849 Other hyperlipidemia: Secondary | ICD-10-CM | POA: Diagnosis not present

## 2021-01-05 DIAGNOSIS — N401 Enlarged prostate with lower urinary tract symptoms: Secondary | ICD-10-CM | POA: Diagnosis not present

## 2021-01-05 DIAGNOSIS — I10 Essential (primary) hypertension: Secondary | ICD-10-CM | POA: Diagnosis not present

## 2021-01-05 DIAGNOSIS — N138 Other obstructive and reflux uropathy: Secondary | ICD-10-CM | POA: Diagnosis not present

## 2021-01-12 DIAGNOSIS — R69 Illness, unspecified: Secondary | ICD-10-CM | POA: Diagnosis not present

## 2021-01-12 DIAGNOSIS — Z23 Encounter for immunization: Secondary | ICD-10-CM | POA: Diagnosis not present

## 2021-02-04 DIAGNOSIS — E7849 Other hyperlipidemia: Secondary | ICD-10-CM | POA: Diagnosis not present

## 2021-02-04 DIAGNOSIS — E7800 Pure hypercholesterolemia, unspecified: Secondary | ICD-10-CM | POA: Diagnosis not present

## 2021-02-04 DIAGNOSIS — E7801 Familial hypercholesterolemia: Secondary | ICD-10-CM | POA: Diagnosis not present

## 2021-02-04 DIAGNOSIS — I10 Essential (primary) hypertension: Secondary | ICD-10-CM | POA: Diagnosis not present

## 2021-02-04 DIAGNOSIS — E782 Mixed hyperlipidemia: Secondary | ICD-10-CM | POA: Diagnosis not present

## 2021-02-04 DIAGNOSIS — R739 Hyperglycemia, unspecified: Secondary | ICD-10-CM | POA: Diagnosis not present

## 2021-02-08 DIAGNOSIS — I259 Chronic ischemic heart disease, unspecified: Secondary | ICD-10-CM | POA: Diagnosis not present

## 2021-02-08 DIAGNOSIS — I10 Essential (primary) hypertension: Secondary | ICD-10-CM | POA: Diagnosis not present

## 2021-02-08 DIAGNOSIS — K7581 Nonalcoholic steatohepatitis (NASH): Secondary | ICD-10-CM | POA: Diagnosis not present

## 2021-02-08 DIAGNOSIS — Z23 Encounter for immunization: Secondary | ICD-10-CM | POA: Diagnosis not present

## 2021-02-08 DIAGNOSIS — E8881 Metabolic syndrome: Secondary | ICD-10-CM | POA: Diagnosis not present

## 2021-02-08 DIAGNOSIS — S161XXA Strain of muscle, fascia and tendon at neck level, initial encounter: Secondary | ICD-10-CM | POA: Diagnosis not present

## 2021-02-08 DIAGNOSIS — E781 Pure hyperglyceridemia: Secondary | ICD-10-CM | POA: Diagnosis not present

## 2021-02-08 DIAGNOSIS — N401 Enlarged prostate with lower urinary tract symptoms: Secondary | ICD-10-CM | POA: Diagnosis not present

## 2021-03-17 NOTE — Progress Notes (Signed)
Cardiology Office Note  Date: 03/18/2021   ID: Kyle Boyd, DOB 1951-01-16, MRN [id number]  PCP:  Manon Hilding, MD  Cardiologist:  Rozann Lesches, MD Electrophysiologist:  None   Chief Complaint  Patient presents with   Cardiac follow-up    History of Present Illness: Kyle Boyd is a 70 y.o. male last seen in March.  He is here for a routine visit.  Since last encounter he does not describe any significant angina symptoms or nitroglycerin use.  Still walking at [facility] for 3-1/2 miles most days of the week.  Reports NYHA class II dyspnea.  No palpitations or syncope.  Follow-up echocardiogram in April revealed LVEF 60 to 65% and mildly sclerotic and thickened aortic valve although without stenosis.  I reviewed his medications which are listed below.  ECG today shows sinus bradycardia with rightward axis.  Also plan to request recent lab work from [name].  Past Medical History:  Diagnosis Date   CAD (coronary artery disease)    Multivessel status post CABG 2010   Carotid artery disease [facility])    Erectile dysfunction    Essential hypertension     Past Surgical History:  Procedure Laterality Date   CATARACT EXTRACTION W/PHACO Left 01/09/2020   Procedure: CATARACT EXTRACTION PHACO AND INTRAOCULAR LENS PLACEMENT ([place]);  Surgeon: Baruch Goldmann, MD;  Location: [facility];  Service: Ophthalmology;  Laterality: Left;  CDE: 6.99   CATARACT EXTRACTION W/PHACO Right 01/23/2020   Procedure: CATARACT EXTRACTION PHACO AND INTRAOCULAR LENS PLACEMENT RIGHT EYE;  Surgeon: Baruch Goldmann, MD;  Location: [facility];  Service: Ophthalmology;  Laterality: Right;  CDE: 6.11   CLEFT PALATE REPAIR     CORONARY ARTERY BYPASS GRAFT  October 2010   Dr. [name] - LIMA to LAD, SVG to diagonal, SVG to OM, SVG to RCA    Current Outpatient Medications  Medication Sig Dispense Refill   acetaminophen (TYLENOL) 500 MG tablet Take 500 mg by mouth every 6 (six) hours as needed for moderate pain.      aspirin EC 81 MG tablet Take 1 tablet (81 mg total) by mouth daily. 90 tablet 3   lisinopril (PRINIVIL,ZESTRIL) 5 MG tablet Take 5 mg by mouth daily.      metoprolol tartrate (LOPRESSOR) 25 MG tablet Take 1 tablet (25 mg total) by mouth 2 (two) times daily. 60 tablet 6   nitroGLYCERIN (NITROSTAT) 0.4 MG SL tablet Place 1 tablet (0.4 mg total) under the tongue every 5 (five) minutes x 3 doses as needed. If no relief after 3rd dose, proceed to the ED for an evaluation 25 tablet 3   simvastatin (ZOCOR) 40 MG tablet Take 40 mg by mouth at bedtime.     tobramycin (TOBREX) 0.3 % ophthalmic solution 3 drops 3 (three) times daily.     No current facility-administered medications for this visit.   Allergies:  Patient has no known allergies.   ROS: No orthopnea or PND.  No syncope.  Physical Exam: VS:  BP 116/76   Pulse (!) 56   Ht 5\' 4"  (1.626 m)   Wt 179 lb (81.2 kg)   SpO2 96%   BMI 30.73 kg/m , BMI Body mass index is 30.73 kg/m.  Wt Readings from Last 3 Encounters:  03/18/21 179 lb (81.2 kg)  07/28/20 171 lb (77.6 kg)  01/29/20 172 lb (78 kg)    General: Patient appears comfortable at rest. HEENT: Conjunctiva and lids normal, wearing a mask. Neck: Supple, no elevated JVP  or carotid bruits, no thyromegaly. Lungs: Clear to auscultation, nonlabored breathing at rest. Cardiac: Regular rate and rhythm, no S3, 8-7/5 systolic murmur, no pericardial rub. Extremities: No pitting edema.  ECG:  An ECG dated 01/29/2020 was personally reviewed today and demonstrated:  Sinus rhythm.  Recent Labwork:  No interval lab work for review today.  Other Studies Reviewed Today:  Echocardiogram 08/17/2020:  1. Left ventricular ejection fraction, by estimation, is 60 to 65%. The  left ventricle has normal function. The left ventricle has no regional  wall motion abnormalities. Left ventricular diastolic parameters are  indeterminate.   2. Right ventricular systolic function is normal. The right  ventricular  size is normal.   3. Left atrial size was mildly dilated.   4. The mitral valve is normal in structure. No evidence of mitral valve  regurgitation. No evidence of mitral stenosis.   5. The aortic valve has an indeterminant number of cusps. There is mild  calcification of the aortic valve. There is mild thickening of the aortic  valve. Aortic valve regurgitation is not visualized. No aortic stenosis is  present.   6. The inferior vena cava is normal in size with greater than 50%  respiratory variability, suggesting right atrial pressure of 3 mmHg.   Assessment and Plan:  1.  CAD status post CABG in 2010.  We have continued medical therapy and observation in the absence of angina symptoms and also regular walking plan for exercise without change in stamina.  ECG reviewed today.  Continue aspirin, Lopressor, lisinopril, Zocor, and as needed nitroglycerin.  2.  Aortic valve sclerosis without stenosis.  3.  Essential hypertension, blood pressure is well controlled today.  4.  Mixed hyperlipidemia, requesting lab work from [name] for review.  Medication Adjustments/Labs and Tests Ordered: Current medicines are reviewed at length with the patient today.  Concerns regarding medicines are outlined above.   Tests Ordered: Orders Placed This Encounter  Procedures   EKG 12-Lead    Medication Changes: No orders of the defined types were placed in this encounter.   Disposition:  Follow up  6 months.  Signed, Satira Sark, MD, [facility] 03/18/2021 8:48 AM    [facility] [place] [address], [place], [place] [address] Phone: [phone]; Fax: [phone]

## 2021-03-18 ENCOUNTER — Ambulatory Visit: Payer: Medicare HMO | Admitting: Cardiology

## 2021-03-18 ENCOUNTER — Other Ambulatory Visit: Payer: Self-pay

## 2021-03-18 ENCOUNTER — Encounter: Payer: Self-pay | Admitting: Cardiology

## 2021-03-18 VITALS — BP 116/76 | HR 56 | Ht 64.0 in | Wt 179.0 lb

## 2021-03-18 DIAGNOSIS — I25119 Atherosclerotic heart disease of native coronary artery with unspecified angina pectoris: Secondary | ICD-10-CM

## 2021-03-18 DIAGNOSIS — I10 Essential (primary) hypertension: Secondary | ICD-10-CM | POA: Diagnosis not present

## 2021-03-18 DIAGNOSIS — E782 Mixed hyperlipidemia: Secondary | ICD-10-CM

## 2021-03-18 NOTE — Patient Instructions (Signed)
Medication Instructions:  Your physician recommends that you continue on your current medications as directed. Please refer to the Current Medication list given to you today.   Labwork: NONE TODAY    Testing/Procedures: None today  Follow-Up: 6 months Dr.McDowell  Any Other Special Instructions Will Be Listed Below (If Applicable).  If you need a refill on your cardiac medications before your next appointment, please call your pharmacy.

## 2021-06-28 DIAGNOSIS — L309 Dermatitis, unspecified: Secondary | ICD-10-CM | POA: Diagnosis not present

## 2021-06-28 DIAGNOSIS — L570 Actinic keratosis: Secondary | ICD-10-CM | POA: Diagnosis not present

## 2021-06-28 DIAGNOSIS — Z85828 Personal history of other malignant neoplasm of skin: Secondary | ICD-10-CM | POA: Diagnosis not present

## 2021-08-04 DIAGNOSIS — E7800 Pure hypercholesterolemia, unspecified: Secondary | ICD-10-CM | POA: Diagnosis not present

## 2021-08-04 DIAGNOSIS — I10 Essential (primary) hypertension: Secondary | ICD-10-CM | POA: Diagnosis not present

## 2021-08-04 DIAGNOSIS — E782 Mixed hyperlipidemia: Secondary | ICD-10-CM | POA: Diagnosis not present

## 2021-08-04 DIAGNOSIS — R739 Hyperglycemia, unspecified: Secondary | ICD-10-CM | POA: Diagnosis not present

## 2021-08-04 DIAGNOSIS — E7801 Familial hypercholesterolemia: Secondary | ICD-10-CM | POA: Diagnosis not present

## 2021-08-04 DIAGNOSIS — E7849 Other hyperlipidemia: Secondary | ICD-10-CM | POA: Diagnosis not present

## 2021-08-08 DIAGNOSIS — R252 Cramp and spasm: Secondary | ICD-10-CM | POA: Diagnosis not present

## 2021-08-08 DIAGNOSIS — E7801 Familial hypercholesterolemia: Secondary | ICD-10-CM | POA: Diagnosis not present

## 2021-08-08 DIAGNOSIS — I259 Chronic ischemic heart disease, unspecified: Secondary | ICD-10-CM | POA: Diagnosis not present

## 2021-08-08 DIAGNOSIS — S161XXA Strain of muscle, fascia and tendon at neck level, initial encounter: Secondary | ICD-10-CM | POA: Diagnosis not present

## 2021-08-08 DIAGNOSIS — I10 Essential (primary) hypertension: Secondary | ICD-10-CM | POA: Diagnosis not present

## 2021-08-08 DIAGNOSIS — R69 Illness, unspecified: Secondary | ICD-10-CM | POA: Diagnosis not present

## 2021-08-08 DIAGNOSIS — I700 Atherosclerosis of aorta: Secondary | ICD-10-CM | POA: Diagnosis not present

## 2021-08-08 DIAGNOSIS — N401 Enlarged prostate with lower urinary tract symptoms: Secondary | ICD-10-CM | POA: Diagnosis not present

## 2021-09-27 DIAGNOSIS — E669 Obesity, unspecified: Secondary | ICD-10-CM | POA: Diagnosis not present

## 2021-09-27 DIAGNOSIS — N529 Male erectile dysfunction, unspecified: Secondary | ICD-10-CM | POA: Diagnosis not present

## 2021-09-27 DIAGNOSIS — Z6830 Body mass index (BMI) 30.0-30.9, adult: Secondary | ICD-10-CM | POA: Diagnosis not present

## 2021-09-27 DIAGNOSIS — Z8249 Family history of ischemic heart disease and other diseases of the circulatory system: Secondary | ICD-10-CM | POA: Diagnosis not present

## 2021-09-27 DIAGNOSIS — E785 Hyperlipidemia, unspecified: Secondary | ICD-10-CM | POA: Diagnosis not present

## 2021-09-27 DIAGNOSIS — Z87891 Personal history of nicotine dependence: Secondary | ICD-10-CM | POA: Diagnosis not present

## 2021-09-27 DIAGNOSIS — I2510 Atherosclerotic heart disease of native coronary artery without angina pectoris: Secondary | ICD-10-CM | POA: Diagnosis not present

## 2021-09-27 DIAGNOSIS — Z7982 Long term (current) use of aspirin: Secondary | ICD-10-CM | POA: Diagnosis not present

## 2021-09-27 DIAGNOSIS — M1990 Unspecified osteoarthritis, unspecified site: Secondary | ICD-10-CM | POA: Diagnosis not present

## 2021-09-27 DIAGNOSIS — I10 Essential (primary) hypertension: Secondary | ICD-10-CM | POA: Diagnosis not present

## 2021-09-27 DIAGNOSIS — Z85828 Personal history of other malignant neoplasm of skin: Secondary | ICD-10-CM | POA: Diagnosis not present

## 2021-12-19 DIAGNOSIS — L570 Actinic keratosis: Secondary | ICD-10-CM | POA: Diagnosis not present

## 2022-02-03 DIAGNOSIS — E8881 Metabolic syndrome: Secondary | ICD-10-CM | POA: Diagnosis not present

## 2022-02-03 DIAGNOSIS — I10 Essential (primary) hypertension: Secondary | ICD-10-CM | POA: Diagnosis not present

## 2022-02-03 DIAGNOSIS — R739 Hyperglycemia, unspecified: Secondary | ICD-10-CM | POA: Diagnosis not present

## 2022-02-03 DIAGNOSIS — E7849 Other hyperlipidemia: Secondary | ICD-10-CM | POA: Diagnosis not present

## 2022-02-07 DIAGNOSIS — R739 Hyperglycemia, unspecified: Secondary | ICD-10-CM | POA: Diagnosis not present

## 2022-02-07 DIAGNOSIS — Z23 Encounter for immunization: Secondary | ICD-10-CM | POA: Diagnosis not present

## 2022-02-07 DIAGNOSIS — Z86010 Personal history of colonic polyps: Secondary | ICD-10-CM | POA: Diagnosis not present

## 2022-02-07 DIAGNOSIS — E875 Hyperkalemia: Secondary | ICD-10-CM | POA: Diagnosis not present

## 2022-02-07 DIAGNOSIS — R252 Cramp and spasm: Secondary | ICD-10-CM | POA: Diagnosis not present

## 2022-02-07 DIAGNOSIS — I700 Atherosclerosis of aorta: Secondary | ICD-10-CM | POA: Diagnosis not present

## 2022-02-07 DIAGNOSIS — Z8639 Personal history of other endocrine, nutritional and metabolic disease: Secondary | ICD-10-CM | POA: Diagnosis not present

## 2022-02-07 DIAGNOSIS — N138 Other obstructive and reflux uropathy: Secondary | ICD-10-CM | POA: Diagnosis not present

## 2022-02-07 DIAGNOSIS — I259 Chronic ischemic heart disease, unspecified: Secondary | ICD-10-CM | POA: Diagnosis not present

## 2022-02-08 ENCOUNTER — Ambulatory Visit: Payer: Medicare HMO | Attending: Cardiology | Admitting: Cardiology

## 2022-02-08 ENCOUNTER — Encounter: Payer: Self-pay | Admitting: Cardiology

## 2022-02-08 VITALS — BP 124/62 | HR 84 | Ht 64.0 in | Wt 183.0 lb

## 2022-02-08 DIAGNOSIS — I358 Other nonrheumatic aortic valve disorders: Secondary | ICD-10-CM

## 2022-02-08 DIAGNOSIS — E782 Mixed hyperlipidemia: Secondary | ICD-10-CM

## 2022-02-08 DIAGNOSIS — I25119 Atherosclerotic heart disease of native coronary artery with unspecified angina pectoris: Secondary | ICD-10-CM

## 2022-02-08 DIAGNOSIS — I10 Essential (primary) hypertension: Secondary | ICD-10-CM | POA: Diagnosis not present

## 2022-02-08 NOTE — Progress Notes (Signed)
Cardiology Office Note  Date: 02/08/2022   ID: Kyle Boyd, Kyle Boyd [birth year], MRN [id number]  PCP:  Kyle Hilding, MD  Cardiologist:  Kyle Lesches, MD Electrophysiologist:  None   Chief Complaint  Patient presents with   Cardiac follow-up    History of Present Illness: Kyle Boyd is a 71 y.o. male last seen in November 2022.  He is here today for a routine visit.  He reports no angina or nitroglycerin use.  Still walking 3-1/2 miles almost every day for exercise in the mornings.  NYHA class II dyspnea.  I reviewed his medications which are outlined below.  I also went over his lab work.  He was taken off lisinopril by Dr. Quintin Boyd just recently secondary to potassium of 5.4.  Renal function was normal.  I asked him to track his blood pressure at home to see if there is an increase to require different antihypertensive therapy.  He is otherwise on Lopressor.  LDL was 50 on Zocor.  I personally reviewed his ECG today which shows normal sinus rhythm.  Past Medical History:  Diagnosis Date   CAD (coronary artery disease)    Multivessel status post CABG 2010   Carotid artery disease [facility])    Erectile dysfunction    Essential hypertension     Past Surgical History:  Procedure Laterality Date   CATARACT EXTRACTION W/PHACO Left 01/09/2020   Procedure: CATARACT EXTRACTION PHACO AND INTRAOCULAR LENS PLACEMENT (Kyle Boyd);  Surgeon: Kyle Goldmann, MD;  Location: [facility];  Service: Ophthalmology;  Laterality: Left;  CDE: 6.99   CATARACT EXTRACTION W/PHACO Right 01/23/2020   Procedure: CATARACT EXTRACTION PHACO AND INTRAOCULAR LENS PLACEMENT RIGHT EYE;  Surgeon: Kyle Goldmann, MD;  Location: [facility];  Service: Ophthalmology;  Laterality: Right;  CDE: 6.11   CLEFT PALATE REPAIR     CORONARY ARTERY BYPASS GRAFT  October 2010   Dr. Prescott Boyd - LIMA to LAD, SVG to diagonal, SVG to OM, SVG to RCA    Current Outpatient Medications  Medication Sig Dispense Refill   acetaminophen (TYLENOL) 500  MG tablet Take 500 mg by mouth every 6 (six) hours as needed for moderate pain.     aspirin EC 81 MG tablet Take 1 tablet (81 mg total) by mouth daily. 90 tablet 3   metoprolol tartrate (LOPRESSOR) 25 MG tablet Take 1 tablet (25 mg total) by mouth 2 (two) times daily. 60 tablet 6   nitroGLYCERIN (NITROSTAT) 0.4 MG SL tablet Place 1 tablet (0.4 mg total) under the tongue every 5 (five) minutes x 3 doses as needed. If no relief after 3rd dose, proceed to the ED for an evaluation 25 tablet 3   simvastatin (ZOCOR) 40 MG tablet Take 40 mg by mouth at bedtime.     tobramycin (TOBREX) 0.3 % ophthalmic solution 3 drops 3 (three) times daily.     No current facility-administered medications for this visit.   Allergies:  Patient has no known allergies.   Social History: The patient  reports that he has never smoked. He has never used smokeless tobacco. He reports that he does not drink alcohol and does not use drugs.   Family History: The patient's family history includes Heart disease in his mother.   ROS: No palpitations or syncope.  Physical Exam: VS:  BP 124/62   Pulse 84   Ht '5\' 4"'$  (1.626 m)   Wt 183 lb (83 kg)   SpO2 96%   BMI 31.41 kg/m , BMI Body mass index  is 31.41 kg/m.  Wt Readings from Last 3 Encounters:  02/08/22 183 lb (83 kg)  03/18/21 179 lb (81.2 kg)  07/28/20 171 lb (77.6 kg)    General: Patient appears comfortable at rest. HEENT: Conjunctiva and lids normal. Neck: Supple, no elevated JVP or carotid bruits. Lungs: Clear to auscultation, nonlabored breathing at rest. Cardiac: Regular rate and rhythm, no S3, 3/6 systolic murmur, no pericardial rub. Abdomen: Soft, nontender, bowel sounds present. Extremities: No pitting edema.  ECG:  An ECG dated 03/18/2021 was personally reviewed today and demonstrated:  Sinus bradycardia with rightward axis.  Recent Labwork:  September 2022: BUN 14, creatinine 0.88, potassium 4.6, AST 36, ALT 37, hemoglobin A1c 5.6%, cholesterol  122, triglycerides 181, HDL 27, LDL 64 September 2023: Potassium 5.4, BUN 14, creatinine 1.06, AST 32, ALT 27, cholesterol 109, HDL 31, triglycerides 138, LDL 50, hemoglobin A1c 5.5%  Other Studies Reviewed Today:  Echocardiogram 08/17/2020:  1. Left ventricular ejection fraction, by estimation, is 60 to 65%. The  left ventricle has normal function. The left ventricle has no regional  wall motion abnormalities. Left ventricular diastolic parameters are  indeterminate.   2. Right ventricular systolic function is normal. The right ventricular  size is normal.   3. Left atrial size was mildly dilated.   4. The mitral valve is normal in structure. No evidence of mitral valve  regurgitation. No evidence of mitral stenosis.   5. The aortic valve has an indeterminant number of cusps. There is mild  calcification of the aortic valve. There is mild thickening of the aortic  valve. Aortic valve regurgitation is not visualized. No aortic stenosis is  present.   6. The inferior vena cava is normal in size with greater than 50%  respiratory variability, suggesting right atrial pressure of 3 mmHg.   Assessment and Plan:  1.  CAD status post CABG in 2010.  He is doing well with regular walking plan and no active angina, stable NYHA class II dyspnea.  ECG is normal today.  Continue aspirin, Lopressor, Zocor, and as needed nitroglycerin.  2.  Mixed hyperlipidemia on Zocor, LDL well controlled at 50.  3.  Essential hypertension, blood pressure is reasonable today.  He was just taken off lisinopril by Dr. Quintin Boyd secondary to hyperkalemia.  I asked him to track blood pressure with home cuff and report back if this begins to increase.  Continue current dose of Lopressor.  4.  Aortic valve sclerosis without stenosis by echocardiogram in April of last year.  Medication Adjustments/Labs and Tests Ordered: Current medicines are reviewed at length with the patient today.  Concerns regarding medicines are  outlined above.   Tests Ordered: Orders Placed This Encounter  Procedures   EKG 12-Lead    Medication Changes: No orders of the defined types were placed in this encounter.   Disposition:  Follow up  1 year.  Signed, Satira Sark, MD, [facility] 02/08/2022 4:40 PM    [facility] [place], [place], [place] [address] Phone: [phone]; Fax: [phone]

## 2022-02-08 NOTE — Patient Instructions (Addendum)
Medication Instructions:  Your physician recommends that you continue on your current medications as directed. Please refer to the Current Medication list given to you today.  Labwork: none  Testing/Procedures: none  Follow-Up: Your physician recommends that you schedule a follow-up appointment in: 1 year. You will receive a reminder call in the mail in about 10 months reminding you to call and schedule your appointment. If you don't receive this call, please contact our office.  Any Other Special Instructions Will Be Listed Below (If Applicable). Your physician has requested that you regularly monitor and record your blood pressure readings at home daily. Please use the same machine at the same time of day to check your readings and record them. Call office in 2 weeks if your home blood pressure readings are elevated.   If you need a refill on your cardiac medications before your next appointment, please call your pharmacy.

## 2022-02-22 DIAGNOSIS — R69 Illness, unspecified: Secondary | ICD-10-CM | POA: Diagnosis not present

## 2022-02-22 DIAGNOSIS — Z6830 Body mass index (BMI) 30.0-30.9, adult: Secondary | ICD-10-CM | POA: Diagnosis not present

## 2022-02-22 DIAGNOSIS — J0101 Acute recurrent maxillary sinusitis: Secondary | ICD-10-CM | POA: Diagnosis not present

## 2022-02-22 DIAGNOSIS — R030 Elevated blood-pressure reading, without diagnosis of hypertension: Secondary | ICD-10-CM | POA: Diagnosis not present

## 2022-02-22 DIAGNOSIS — Z20828 Contact with and (suspected) exposure to other viral communicable diseases: Secondary | ICD-10-CM | POA: Diagnosis not present

## 2022-03-22 DIAGNOSIS — Z23 Encounter for immunization: Secondary | ICD-10-CM | POA: Diagnosis not present

## 2022-06-12 DIAGNOSIS — H6692 Otitis media, unspecified, left ear: Secondary | ICD-10-CM | POA: Diagnosis not present

## 2022-06-12 DIAGNOSIS — R69 Illness, unspecified: Secondary | ICD-10-CM | POA: Diagnosis not present

## 2022-06-12 DIAGNOSIS — J069 Acute upper respiratory infection, unspecified: Secondary | ICD-10-CM | POA: Diagnosis not present

## 2022-06-12 DIAGNOSIS — R030 Elevated blood-pressure reading, without diagnosis of hypertension: Secondary | ICD-10-CM | POA: Diagnosis not present

## 2022-06-12 DIAGNOSIS — Z6830 Body mass index (BMI) 30.0-30.9, adult: Secondary | ICD-10-CM | POA: Diagnosis not present

## 2022-06-21 DIAGNOSIS — B356 Tinea cruris: Secondary | ICD-10-CM | POA: Diagnosis not present

## 2022-06-21 DIAGNOSIS — Z85828 Personal history of other malignant neoplasm of skin: Secondary | ICD-10-CM | POA: Diagnosis not present

## 2022-06-21 DIAGNOSIS — L570 Actinic keratosis: Secondary | ICD-10-CM | POA: Diagnosis not present

## 2022-08-02 DIAGNOSIS — E7849 Other hyperlipidemia: Secondary | ICD-10-CM | POA: Diagnosis not present

## 2022-08-02 DIAGNOSIS — E7801 Familial hypercholesterolemia: Secondary | ICD-10-CM | POA: Diagnosis not present

## 2022-08-02 DIAGNOSIS — R739 Hyperglycemia, unspecified: Secondary | ICD-10-CM | POA: Diagnosis not present

## 2022-08-09 DIAGNOSIS — Z23 Encounter for immunization: Secondary | ICD-10-CM | POA: Diagnosis not present

## 2022-08-09 DIAGNOSIS — N401 Enlarged prostate with lower urinary tract symptoms: Secondary | ICD-10-CM | POA: Diagnosis not present

## 2022-08-09 DIAGNOSIS — I10 Essential (primary) hypertension: Secondary | ICD-10-CM | POA: Diagnosis not present

## 2022-08-09 DIAGNOSIS — N138 Other obstructive and reflux uropathy: Secondary | ICD-10-CM | POA: Diagnosis not present

## 2022-08-09 DIAGNOSIS — I259 Chronic ischemic heart disease, unspecified: Secondary | ICD-10-CM | POA: Diagnosis not present

## 2022-08-09 DIAGNOSIS — E875 Hyperkalemia: Secondary | ICD-10-CM | POA: Diagnosis not present

## 2022-08-09 DIAGNOSIS — E781 Pure hyperglyceridemia: Secondary | ICD-10-CM | POA: Diagnosis not present

## 2022-08-09 DIAGNOSIS — I700 Atherosclerosis of aorta: Secondary | ICD-10-CM | POA: Diagnosis not present

## 2022-08-09 DIAGNOSIS — E7849 Other hyperlipidemia: Secondary | ICD-10-CM | POA: Diagnosis not present

## 2022-08-09 DIAGNOSIS — K7581 Nonalcoholic steatohepatitis (NASH): Secondary | ICD-10-CM | POA: Diagnosis not present

## 2022-08-09 DIAGNOSIS — Z8639 Personal history of other endocrine, nutritional and metabolic disease: Secondary | ICD-10-CM | POA: Diagnosis not present

## 2022-08-09 DIAGNOSIS — F17210 Nicotine dependence, cigarettes, uncomplicated: Secondary | ICD-10-CM | POA: Diagnosis not present

## 2022-08-10 DIAGNOSIS — N529 Male erectile dysfunction, unspecified: Secondary | ICD-10-CM | POA: Diagnosis not present

## 2022-08-10 DIAGNOSIS — I700 Atherosclerosis of aorta: Secondary | ICD-10-CM | POA: Diagnosis not present

## 2022-08-10 DIAGNOSIS — M1990 Unspecified osteoarthritis, unspecified site: Secondary | ICD-10-CM | POA: Diagnosis not present

## 2022-08-10 DIAGNOSIS — R011 Cardiac murmur, unspecified: Secondary | ICD-10-CM | POA: Diagnosis not present

## 2022-08-10 DIAGNOSIS — I252 Old myocardial infarction: Secondary | ICD-10-CM | POA: Diagnosis not present

## 2022-08-10 DIAGNOSIS — H9193 Unspecified hearing loss, bilateral: Secondary | ICD-10-CM | POA: Diagnosis not present

## 2022-08-10 DIAGNOSIS — I25119 Atherosclerotic heart disease of native coronary artery with unspecified angina pectoris: Secondary | ICD-10-CM | POA: Diagnosis not present

## 2022-08-10 DIAGNOSIS — Z961 Presence of intraocular lens: Secondary | ICD-10-CM | POA: Diagnosis not present

## 2022-08-10 DIAGNOSIS — Z8249 Family history of ischemic heart disease and other diseases of the circulatory system: Secondary | ICD-10-CM | POA: Diagnosis not present

## 2022-08-10 DIAGNOSIS — Z87891 Personal history of nicotine dependence: Secondary | ICD-10-CM | POA: Diagnosis not present

## 2022-08-10 DIAGNOSIS — E785 Hyperlipidemia, unspecified: Secondary | ICD-10-CM | POA: Diagnosis not present

## 2022-08-10 DIAGNOSIS — Z008 Encounter for other general examination: Secondary | ICD-10-CM | POA: Diagnosis not present

## 2022-08-10 DIAGNOSIS — N189 Chronic kidney disease, unspecified: Secondary | ICD-10-CM | POA: Diagnosis not present

## 2022-12-20 DIAGNOSIS — Z85828 Personal history of other malignant neoplasm of skin: Secondary | ICD-10-CM | POA: Diagnosis not present

## 2022-12-20 DIAGNOSIS — L570 Actinic keratosis: Secondary | ICD-10-CM | POA: Diagnosis not present

## 2022-12-20 DIAGNOSIS — D485 Neoplasm of uncertain behavior of skin: Secondary | ICD-10-CM | POA: Diagnosis not present

## 2022-12-20 DIAGNOSIS — Z1283 Encounter for screening for malignant neoplasm of skin: Secondary | ICD-10-CM | POA: Diagnosis not present

## 2023-02-08 DIAGNOSIS — I10 Essential (primary) hypertension: Secondary | ICD-10-CM | POA: Diagnosis not present

## 2023-02-08 DIAGNOSIS — E875 Hyperkalemia: Secondary | ICD-10-CM | POA: Diagnosis not present

## 2023-02-08 DIAGNOSIS — Z8639 Personal history of other endocrine, nutritional and metabolic disease: Secondary | ICD-10-CM | POA: Diagnosis not present

## 2023-02-08 DIAGNOSIS — Z23 Encounter for immunization: Secondary | ICD-10-CM | POA: Diagnosis not present

## 2023-02-08 DIAGNOSIS — N401 Enlarged prostate with lower urinary tract symptoms: Secondary | ICD-10-CM | POA: Diagnosis not present

## 2023-02-08 DIAGNOSIS — N138 Other obstructive and reflux uropathy: Secondary | ICD-10-CM | POA: Diagnosis not present

## 2023-02-08 DIAGNOSIS — R739 Hyperglycemia, unspecified: Secondary | ICD-10-CM | POA: Diagnosis not present

## 2023-02-08 DIAGNOSIS — E7849 Other hyperlipidemia: Secondary | ICD-10-CM | POA: Diagnosis not present

## 2023-02-08 DIAGNOSIS — E781 Pure hyperglyceridemia: Secondary | ICD-10-CM | POA: Diagnosis not present

## 2023-02-08 DIAGNOSIS — I700 Atherosclerosis of aorta: Secondary | ICD-10-CM | POA: Diagnosis not present

## 2023-02-08 DIAGNOSIS — K7581 Nonalcoholic steatohepatitis (NASH): Secondary | ICD-10-CM | POA: Diagnosis not present

## 2023-02-08 DIAGNOSIS — R519 Headache, unspecified: Secondary | ICD-10-CM | POA: Diagnosis not present

## 2023-04-12 NOTE — Progress Notes (Signed)
Name: Kyle Boyd DOB: 1951-04-07 MRN: [id number]  History of Present Illness: Kyle Boyd is a 72 y.o. male who presents today as a new patient at [facility]. All available relevant medical records have been reviewed.  - GU History: 1. Erectile dysfunction.  He reports chief complaint of weak intermittent urinary stream, straining to void, occasional sensations of incomplete emptying, urinary urgency, frequency, nocturia x2, and urge incontinence a few times per week (mostly at night). Denies urinary hesitancy, dysuria, gross hematuria.   He denies known history of elevated PSA. Scanned labs from 02/04/2021 & 02/03/2022 do not include PSA results. He denies known family history of prostate cancer.  He reports bother related to erectile dysfunction; states he is getting weak erections. Denies penile curvature.  He denies history of hypogonadism / low testosterone.  He denies fatigue, decreased sexual desire/ libido, decreased muscle mass, weakness. He reports history of GU surgery (vasectomy). He denies  prior use of PDE-5 inhibitors (Viagra, Cialis, Levitra). He has a prescription for nitrates but denies ever needing to take that. He denies  history of diabetes.   Fall Screening: Do you usually have a device to assist in your mobility? No   Medications: Current Outpatient Medications  Medication Sig Dispense Refill   acetaminophen (TYLENOL) 500 MG tablet Take 500 mg by mouth every 6 (six) hours as needed for moderate pain.     aspirin EC 81 MG tablet Take 1 tablet (81 mg total) by mouth daily. 90 tablet 3   metoprolol tartrate (LOPRESSOR) 25 MG tablet Take 1 tablet (25 mg total) by mouth 2 (two) times daily. 60 tablet 6   nitroGLYCERIN (NITROSTAT) 0.4 MG SL tablet Place 1 tablet (0.4 mg total) under the tongue every 5 (five) minutes x 3 doses as needed. If no relief after 3rd dose, proceed to the ED for an evaluation 25 tablet 3   simvastatin (ZOCOR) 40 MG  tablet Take 40 mg by mouth at bedtime.     tobramycin (TOBREX) 0.3 % ophthalmic solution 3 drops 3 (three) times daily.     No current facility-administered medications for this visit.    Allergies: No Known Allergies  Past Medical History:  Diagnosis Date   CAD (coronary artery disease)    Multivessel status post CABG 2010   Carotid artery disease [facility])    Erectile dysfunction    Essential hypertension    Past Surgical History:  Procedure Laterality Date   CATARACT EXTRACTION W/PHACO Left 01/09/2020   Procedure: CATARACT EXTRACTION PHACO AND INTRAOCULAR LENS PLACEMENT ([facility]);  Surgeon: Fabio Pierce, MD;  Location: [facility];  Service: Ophthalmology;  Laterality: Left;  CDE: 6.99   CATARACT EXTRACTION W/PHACO Right 01/23/2020   Procedure: CATARACT EXTRACTION PHACO AND INTRAOCULAR LENS PLACEMENT RIGHT EYE;  Surgeon: Fabio Pierce, MD;  Location: [facility];  Service: Ophthalmology;  Laterality: Right;  CDE: 6.11   CLEFT PALATE REPAIR     CORONARY ARTERY BYPASS GRAFT  October 2010   Dr. Donata Clay - LIMA to LAD, SVG to diagonal, SVG to OM, SVG to RCA   Family History  Problem Relation Age of Onset   Heart disease Mother    Social History   Socioeconomic History   Marital status: Married    Spouse name: Not on file   Number of children: 2   Years of education: Not on file   Highest education level: Not on file  Occupational History   Occupation: full time    Comment: Cabin crew  facility in [place]  Tobacco Use   Smoking status: Never   Smokeless tobacco: Never  Vaping Use   Vaping status: Never Used  Substance and Sexual Activity   Alcohol use: No    Alcohol/week: 0.0 standard drinks of alcohol   Drug use: No   Sexual activity: Not on file  Other Topics Concern   Not on file  Social History Narrative   Not on file   Social Determinants of Health   Financial Resource Strain: Not on file  Food Insecurity: Not on file  Transportation Needs: Not on file   Physical Activity: Not on file  Stress: Not on file  Social Connections: Not on file  Intimate Partner Violence: Not on file    SUBJECTIVE  Review of Systems Constitutional: Patient denies any unintentional weight loss or change in strength lntegumentary: Patient denies any rashes or pruritus Cardiovascular: Patient denies chest pain or syncope Respiratory: Patient denies shortness of breath Gastrointestinal: Patient denies nausea, vomiting, constipation, or diarrhea Musculoskeletal: Patient denies muscle cramps or weakness Neurologic: Patient denies convulsions or seizures Allergic/Immunologic: Patient denies recent allergic reaction(s) Hematologic/Lymphatic: Patient denies bleeding tendencies Endocrine: Patient denies heat/cold intolerance  GU: As per HPI.  OBJECTIVE Vitals:   04/13/23 0840  BP: (!) 153/79  Pulse: 63  Temp: 97.8 F (36.6 C)   There is no height or weight on file to calculate BMI.  Physical Examination Constitutional: No obvious distress; patient is non-toxic appearing  Cardiovascular: No visible lower extremity edema.  Respiratory: The patient does not have audible wheezing/stridor; respirations do not appear labored  Gastrointestinal: Abdomen non-distended Musculoskeletal: Normal ROM of UEs  Skin: No obvious rashes/open sores  Neurologic: CN 2-12 grossly intact Psychiatric: Answered questions appropriately with normal affect  Hematologic/Lymphatic/Immunologic: No obvious bruises or sites of spontaneous bleeding  Genitourinary: Anal sphincter has normal tone. Prostate is approximately 25 grams and is symmetric and non-nodular without palpable masses, tenderness, warmth, or bogginess.  Rectal vault is without lesions or masses.  Chaperone offered for pelvic exam; patient declined.  UA: negative  PVR: 24 ml  ASSESSMENT Benign prostatic hyperplasia with urinary frequency - Plan: Urinalysis, Routine w reflex microscopic, BLADDER SCAN AMB  NON-IMAGING, PSA  Erectile dysfunction, unspecified erectile dysfunction type - Plan: CBC, Testosterone  1. BPH with LUTS: After careful review of all available data and history and physical examination, it is felt that this patient has symptomatic BPH/BOO. Options for treatment included alpha blockers, 5-AR inhibitors, laser, and TURP were discussed.  Pt elected to proceed with expectant management.  We discussed recommendation to check PSA value, which is a protein produced in normal and neoplastic cells. Age norms and PSA velocity were discussed. Elevated serum PSA may be due to infection, inflammation, BPH, recent urinary catheterization, or cancer. Pt verbalized understanding and agreement; PSA testing ordered.  2. Erectile dysfunction. Thorough discussion was had with patient regarding possible etiologies of erectile dysfunction including psychological, vasculogenic, neurologic, and testosterone deficiency. We agreed to check testosterone levels today. We discussed treatment options and their potential side effects including medication, vacuum pumps, penile injections, MUSE, and penile implants. Patient was advised that not all these options are likely to be covered by insurance. elected to proceed with expectant management. Would need cardiology clearance prior to any use of PDE-5 inhibitors in the future.   Will plan for follow up in 6 months or sooner if needed. Pt verbalized understanding and agreement. All questions were answered.  PLAN Advised the following: Labs today (testosterone, CBC, PSA).  Return in about 6 months (around 10/12/2023) for UA, PVR, & f/u with Evette Georges NP.  Orders Placed This Encounter  Procedures   Urinalysis, Routine w reflex microscopic   PSA   CBC   Testosterone   BLADDER SCAN AMB NON-IMAGING    It has been explained that the patient is to follow regularly with their PCP in addition to all other providers involved in their care and to follow  instructions provided by these respective offices. Patient advised to contact urology clinic if any urologic-pertaining questions, concerns, new symptoms or problems arise in the interim period.  There are no Patient Instructions on file for this visit.  Electronically signed by:  Donnita Falls, MSN, FNP-C, [facility]UNP 04/13/2023 9:31 AM

## 2023-04-13 ENCOUNTER — Encounter: Payer: Self-pay | Admitting: Urology

## 2023-04-13 ENCOUNTER — Ambulatory Visit: Payer: Medicare HMO | Admitting: Urology

## 2023-04-13 VITALS — BP 153/79 | HR 63 | Temp 97.8°F

## 2023-04-13 DIAGNOSIS — R350 Frequency of micturition: Secondary | ICD-10-CM

## 2023-04-13 DIAGNOSIS — R399 Unspecified symptoms and signs involving the genitourinary system: Secondary | ICD-10-CM | POA: Diagnosis not present

## 2023-04-13 DIAGNOSIS — N529 Male erectile dysfunction, unspecified: Secondary | ICD-10-CM

## 2023-04-13 DIAGNOSIS — N401 Enlarged prostate with lower urinary tract symptoms: Secondary | ICD-10-CM

## 2023-04-13 DIAGNOSIS — N400 Enlarged prostate without lower urinary tract symptoms: Secondary | ICD-10-CM | POA: Insufficient documentation

## 2023-04-13 LAB — URINALYSIS, ROUTINE W REFLEX MICROSCOPIC
Bilirubin, UA: NEGATIVE
Glucose, UA: NEGATIVE
Ketones, UA: NEGATIVE
Nitrite, UA: NEGATIVE
Protein,UA: NEGATIVE
RBC, UA: NEGATIVE
Specific Gravity, UA: 1.03 (ref 1.005–1.030)
Urobilinogen, Ur: 0.2 mg/dL (ref 0.2–1.0)
pH, UA: 5.5 (ref 5.0–7.5)

## 2023-04-13 LAB — MICROSCOPIC EXAMINATION
Bacteria, UA: NONE SEEN
RBC, Urine: NONE SEEN /[HPF] (ref 0–2)

## 2023-04-13 LAB — BLADDER SCAN AMB NON-IMAGING: Scan Result: 24

## 2023-04-15 LAB — CBC
Hematocrit: 29 % — ABNORMAL LOW (ref 37.5–51.0)
Hemoglobin: 7.9 g/dL — ABNORMAL LOW (ref 13.0–17.7)
MCH: 21.5 pg — ABNORMAL LOW (ref 26.6–33.0)
MCHC: 27.2 g/dL — ABNORMAL LOW (ref 31.5–35.7)
MCV: 79 fL (ref 79–97)
Platelets: 98 10*3/uL — CL (ref 150–450)
RBC: 3.67 x10E6/uL — ABNORMAL LOW (ref 4.14–5.80)
RDW: 16.3 % — ABNORMAL HIGH (ref 11.6–15.4)
WBC: 4.3 10*3/uL (ref 3.4–10.8)

## 2023-04-15 LAB — TESTOSTERONE: Testosterone: 840 ng/dL (ref 264–916)

## 2023-04-16 ENCOUNTER — Telehealth: Payer: Self-pay

## 2023-04-16 NOTE — Telephone Encounter (Signed)
-----   Message from Donnita Falls sent at 04/16/2023  8:44 AM EST ----- Please let pt know his testosterone level is within the normal range. For some reason his PSA was not drawn at the same time - not sure why? Please ask him to schedule lab visit to get that done. Thanks.

## 2023-04-16 NOTE — Telephone Encounter (Signed)
Patient was made aware and voiced understanding. PSA labs was scheduled with patient

## 2023-04-23 ENCOUNTER — Other Ambulatory Visit: Payer: Medicare HMO

## 2023-04-23 DIAGNOSIS — R350 Frequency of micturition: Secondary | ICD-10-CM | POA: Diagnosis not present

## 2023-04-23 DIAGNOSIS — N401 Enlarged prostate with lower urinary tract symptoms: Secondary | ICD-10-CM | POA: Diagnosis not present

## 2023-04-24 ENCOUNTER — Ambulatory Visit: Payer: Medicare HMO | Attending: Nurse Practitioner | Admitting: Nurse Practitioner

## 2023-04-24 ENCOUNTER — Encounter: Payer: Self-pay | Admitting: Nurse Practitioner

## 2023-04-24 VITALS — BP 114/60 | HR 66 | Ht 64.5 in | Wt 173.2 lb

## 2023-04-24 DIAGNOSIS — E782 Mixed hyperlipidemia: Secondary | ICD-10-CM | POA: Diagnosis not present

## 2023-04-24 DIAGNOSIS — I2510 Atherosclerotic heart disease of native coronary artery without angina pectoris: Secondary | ICD-10-CM | POA: Diagnosis not present

## 2023-04-24 DIAGNOSIS — D649 Anemia, unspecified: Secondary | ICD-10-CM

## 2023-04-24 DIAGNOSIS — I25119 Atherosclerotic heart disease of native coronary artery with unspecified angina pectoris: Secondary | ICD-10-CM

## 2023-04-24 DIAGNOSIS — R011 Cardiac murmur, unspecified: Secondary | ICD-10-CM

## 2023-04-24 DIAGNOSIS — R5383 Other fatigue: Secondary | ICD-10-CM

## 2023-04-24 DIAGNOSIS — I10 Essential (primary) hypertension: Secondary | ICD-10-CM

## 2023-04-24 DIAGNOSIS — D696 Thrombocytopenia, unspecified: Secondary | ICD-10-CM | POA: Diagnosis not present

## 2023-04-24 NOTE — Patient Instructions (Addendum)
Medication Instructions:  Your physician recommends that you continue on your current medications as directed. Please refer to the Current Medication list given to you today.  Labwork: none  Testing/Procedures: Your physician has requested that you have an echocardiogram. Echocardiography is a painless test that uses sound waves to create images of your heart. It provides your doctor with information about the size and shape of your heart and how well your heart's chambers and valves are working. This procedure takes approximately one hour. There are no restrictions for this procedure. Please do NOT wear cologne, perfume, aftershave, or lotions (deodorant is allowed). Please arrive 15 minutes prior to your appointment time.  Please note: We ask at that you not bring children with you during ultrasound (echo/ vascular) testing. Due to room size and safety concerns, children are not allowed in the ultrasound rooms during exams. Our front office staff cannot provide observation of children in our lobby area while testing is being conducted. An adult accompanying a patient to their appointment will only be allowed in the ultrasound room at the discretion of the ultrasound technician under special circumstances. We apologize for any inconvenience.  Follow-Up: Your physician recommends that you schedule a follow-up appointment in: 3 months with Philis Nettle  Any Other Special Instructions Will Be Listed Below (If Applicable).  If you need a refill on your cardiac medications before your next appointment, please call your pharmacy.

## 2023-04-24 NOTE — Progress Notes (Unsigned)
Cardiology Office Note:  .   Date:  04/24/2023 ID:  Kyle Boyd, DOB June 30, 1950, MRN [id number] PCP: Estanislado Pandy, MD  [facility] Providers Cardiologist:  Nona Dell, MD    History of Present Illness: .   Kyle Boyd is a 72 y.o. male with a PMH of CAD, s/p CABG in 2010, mixed hyperlipidemia, hypertension, aortic valve sclerosis, BPH, who presents today for 1 year follow-up.  Last seen by Dr. Diona Browner on February 08, 2022.  Was doing well at that time.   Today presents for scheduled follow-up.  He states he is doing well. Does have some fatigue at times. Denies any chest pain, shortness of breath, palpitations, syncope, presyncope, dizziness, orthopnea, PND, swelling or significant weight changes, acute bleeding, or claudication.  ROS: Negative. See HPI.   Studies Reviewed: Marland Kitchen    EKG:  EKG Interpretation Date/Time:  Tuesday April 24 2023 10:58:52 EST Ventricular Rate:  64 PR Interval:  170 QRS Duration:  90 QT Interval:  404 QTC Calculation: 416 R Axis:   46  Text Interpretation: Normal sinus rhythm Normal ECG When compared with ECG of 07-Jan-2020 08:56, No significant change was found Confirmed by Sharlene Dory [phone]) on 04/24/2023 11:06:39 AM   Echo 08/2020:  1. Left ventricular ejection fraction, by estimation, is 60 to 65%. The  left ventricle has normal function. The left ventricle has no regional  wall motion abnormalities. Left ventricular diastolic parameters are  indeterminate.   2. Right ventricular systolic function is normal. The right ventricular  size is normal.   3. Left atrial size was mildly dilated.   4. The mitral valve is normal in structure. No evidence of mitral valve  regurgitation. No evidence of mitral stenosis.   5. The aortic valve has an indeterminant number of cusps. There is mild  calcification of the aortic valve. There is mild thickening of the aortic  valve. Aortic valve regurgitation is not visualized. No aortic stenosis  is  present.   6. The inferior vena cava is normal in size with greater than 50%  respiratory variability, suggesting right atrial pressure of 3 mmHg.  Physical Exam:   VS:  BP 114/60 (BP Location: Right Arm, Patient Position: Sitting, Cuff Size: Normal)   Pulse 66   Ht 5' 4.5" (1.638 m)   Wt 173 lb 3.2 oz (78.6 kg)   SpO2 99%   BMI 29.27 kg/m    Wt Readings from Last 3 Encounters:  04/24/23 173 lb 3.2 oz (78.6 kg)  02/08/22 183 lb (83 kg)  03/18/21 179 lb (81.2 kg)    GEN: Well nourished, well developed in no acute distress NECK: No JVD; No carotid bruits CARDIAC: S1/S2, RRR, Grade 2/6 murmur, no rubs, no gallops RESPIRATORY:  Clear to auscultation without rales, wheezing or rhonchi  ABDOMEN: Soft, non-tender, non-distended EXTREMITIES:  No edema; No deformity   ASSESSMENT AND PLAN: .    CAD, s/p CABG in 2010 Stable with no anginal symptoms. No indication for ischemic evaluation. Continue ASA, Lopressor, Simvastatin, and NTG PRN. Heart healthy diet and regular cardiovascular exercise encouraged.   Mixed hyperlipidemia No recent labs on file. Will be seeing PCP soon for follow-up. Continue simvastatin. Being managed by PCP. Heart healthy diet and regular cardiovascular exercise encouraged.   HTN BP stable. Discussed to monitor BP at home at least 2 hours after medications and sitting for 5-10 minutes. Continue Lopressor. Heart healthy diet and regular cardiovascular exercise encouraged.   Anemia/thrombocytopenia, fatigue/ murmur Recent labs revealed  Hgb 7.9 with platelet count 98. Recommended to obtain further labs, however pt defers this to his PCP. Recommend PCP draw iron panel, folate, and B12 in addition to any other additional labs, possible stool sample; however pt denies any bleeding issues. Continue to follow with PCP. Will update Echo d/t patient's fatigue and heart murmur.    Dispo: Follow-up with me/APP in 3 months or sooner if anything changes.    Signed, Sharlene Dory, NP

## 2023-04-25 ENCOUNTER — Telehealth: Payer: Self-pay

## 2023-04-25 LAB — PSA: Prostate Specific Ag, Serum: 3.1 ng/mL (ref 0.0–4.0)

## 2023-04-25 NOTE — Telephone Encounter (Signed)
-----   Message from Donnita Falls sent at 04/25/2023 12:59 PM EST ----- Please let pt know his PSA is normal. Follow up as planned. Thanks.

## 2023-04-25 NOTE — Telephone Encounter (Signed)
Patient is made aware via voicemail.

## 2023-04-26 ENCOUNTER — Other Ambulatory Visit: Payer: Medicare HMO

## 2023-05-03 ENCOUNTER — Ambulatory Visit: Payer: Medicare HMO | Attending: Nurse Practitioner

## 2023-05-03 DIAGNOSIS — R011 Cardiac murmur, unspecified: Secondary | ICD-10-CM

## 2023-05-03 LAB — ECHOCARDIOGRAM COMPLETE
AR max vel: 1.88 cm2
AV Area VTI: 1.97 cm2
AV Area mean vel: 1.94 cm2
AV Mean grad: 13 mm[Hg]
AV Peak grad: 27.7 mm[Hg]
Ao pk vel: 2.63 m/s
Area-P 1/2: 3.17 cm2
Calc EF: 57.4 %
MV VTI: 2.71 cm2
S' Lateral: 2.9 cm
Single Plane A2C EF: 53.3 %
Single Plane A4C EF: 63.8 %

## 2023-06-18 DIAGNOSIS — L280 Lichen simplex chronicus: Secondary | ICD-10-CM | POA: Diagnosis not present

## 2023-07-06 DIAGNOSIS — E781 Pure hyperglyceridemia: Secondary | ICD-10-CM | POA: Diagnosis not present

## 2023-07-06 DIAGNOSIS — R739 Hyperglycemia, unspecified: Secondary | ICD-10-CM | POA: Diagnosis not present

## 2023-07-06 DIAGNOSIS — E7849 Other hyperlipidemia: Secondary | ICD-10-CM | POA: Diagnosis not present

## 2023-07-06 DIAGNOSIS — E7801 Familial hypercholesterolemia: Secondary | ICD-10-CM | POA: Diagnosis not present

## 2023-07-06 DIAGNOSIS — E782 Mixed hyperlipidemia: Secondary | ICD-10-CM | POA: Diagnosis not present

## 2023-07-11 DIAGNOSIS — E7849 Other hyperlipidemia: Secondary | ICD-10-CM | POA: Diagnosis not present

## 2023-07-11 DIAGNOSIS — I10 Essential (primary) hypertension: Secondary | ICD-10-CM | POA: Diagnosis not present

## 2023-07-11 DIAGNOSIS — E782 Mixed hyperlipidemia: Secondary | ICD-10-CM | POA: Diagnosis not present

## 2023-07-11 DIAGNOSIS — I700 Atherosclerosis of aorta: Secondary | ICD-10-CM | POA: Diagnosis not present

## 2023-07-11 DIAGNOSIS — Z6829 Body mass index (BMI) 29.0-29.9, adult: Secondary | ICD-10-CM | POA: Diagnosis not present

## 2023-07-11 DIAGNOSIS — Z23 Encounter for immunization: Secondary | ICD-10-CM | POA: Diagnosis not present

## 2023-07-11 DIAGNOSIS — R739 Hyperglycemia, unspecified: Secondary | ICD-10-CM | POA: Diagnosis not present

## 2023-07-18 ENCOUNTER — Encounter: Payer: Self-pay | Admitting: Urology

## 2023-07-23 ENCOUNTER — Ambulatory Visit: Payer: Medicare HMO | Attending: Nurse Practitioner | Admitting: Nurse Practitioner

## 2023-07-23 ENCOUNTER — Encounter: Payer: Self-pay | Admitting: Nurse Practitioner

## 2023-07-23 VITALS — BP 122/62 | HR 71 | Ht 65.0 in | Wt 173.0 lb

## 2023-07-23 DIAGNOSIS — R5383 Other fatigue: Secondary | ICD-10-CM | POA: Diagnosis not present

## 2023-07-23 DIAGNOSIS — E782 Mixed hyperlipidemia: Secondary | ICD-10-CM

## 2023-07-23 DIAGNOSIS — I10 Essential (primary) hypertension: Secondary | ICD-10-CM | POA: Diagnosis not present

## 2023-07-23 DIAGNOSIS — I2510 Atherosclerotic heart disease of native coronary artery without angina pectoris: Secondary | ICD-10-CM

## 2023-07-23 DIAGNOSIS — R29818 Other symptoms and signs involving the nervous system: Secondary | ICD-10-CM

## 2023-07-23 DIAGNOSIS — I25119 Atherosclerotic heart disease of native coronary artery with unspecified angina pectoris: Secondary | ICD-10-CM

## 2023-07-23 DIAGNOSIS — D649 Anemia, unspecified: Secondary | ICD-10-CM

## 2023-07-23 DIAGNOSIS — D696 Thrombocytopenia, unspecified: Secondary | ICD-10-CM

## 2023-07-23 MED ORDER — NITROGLYCERIN 0.4 MG SL SUBL: 0.4000 mg | SUBLINGUAL_TABLET | SUBLINGUAL | 3 refills | Status: DC | PRN

## 2023-07-23 NOTE — Patient Instructions (Addendum)
 Medication Instructions:  Your physician recommends that you continue on your current medications as directed. Please refer to the Current Medication list given to you today.  Labwork: This week  Testing/Procedures: None   Follow-Up: Your physician recommends that you schedule a follow-up appointment in: 4-6 weeks  You have been referred to Pulmonology  Any Other Special Instructions Will Be Listed Below (If Applicable).  If you need a refill on your cardiac medications before your next appointment, please call your pharmacy.

## 2023-07-23 NOTE — Progress Notes (Unsigned)
 Cardiology Office Note:  .   Date:  07/23/2023 ID:  Kyle Boyd, DOB 22-Jul-1950, MRN [id number] PCP: Estanislado Pandy, MD  [facility] Providers Cardiologist:  Nona Dell, MD    History of Present Illness: .   Kyle Boyd is a 73 y.o. male with a PMH of CAD, s/p CABG in 2010, mixed hyperlipidemia, hypertension, aortic valve sclerosis, BPH, who presents today for 1 year follow-up.  Last seen by Dr. Diona Browner on February 08, 2022.  Was doing well at that time.   I last saw him for follow-up on April 24, 2023.  Was overall doing well at the time, did note some fatigue at times.    Today presents for follow-up.  Continues to note fatigue.  Also admits to chest discomfort when he is outside in the cold air/windy outside environment. Denies any recent chest pain, shortness of breath, palpitations, syncope, presyncope, dizziness, orthopnea, PND, swelling or significant weight changes, acute bleeding, or claudication.  ROS: Negative. See HPI.   Studies Reviewed: Marland Kitchen    EKG: EKG is not ordered today.  Echo 04/2023: 1. Left ventricular ejection fraction, by estimation, is 60 to 65%. The  left ventricle has normal function. The left ventricle has no regional  wall motion abnormalities. There is mild concentric left ventricular  hypertrophy. Left ventricular diastolic  parameters are consistent with Grade II diastolic dysfunction  (pseudonormalization).   2. Right ventricular systolic function is normal. The right ventricular  size is normal. There is mildly elevated pulmonary artery systolic  pressure. The estimated right ventricular systolic pressure is 38.3 mmHg.   3. The mitral valve is grossly normal. Trivial mitral valve  regurgitation.   4. The aortic valve is tricuspid. There is mild calcification of the  aortic valve. Aortic valve regurgitation is not visualized. Aortic valve  sclerosis/calcification is present, without any evidence of aortic  stenosis. Aortic valve mean  gradient  measures 13.0 mmHg. Dimentionless index 0.57.   5. The inferior vena cava is normal in size with greater than 50%  respiratory variability, suggesting right atrial pressure of 3 mmHg.   Comparison(s): Prior images reviewed side by side. LVEF normal at 69-65%  range. Moderately sclerotic aortic valve.  Echo 08/2020:  1. Left ventricular ejection fraction, by estimation, is 60 to 65%. The  left ventricle has normal function. The left ventricle has no regional  wall motion abnormalities. Left ventricular diastolic parameters are  indeterminate.   2. Right ventricular systolic function is normal. The right ventricular  size is normal.   3. Left atrial size was mildly dilated.   4. The mitral valve is normal in structure. No evidence of mitral valve  regurgitation. No evidence of mitral stenosis.   5. The aortic valve has an indeterminant number of cusps. There is mild  calcification of the aortic valve. There is mild thickening of the aortic  valve. Aortic valve regurgitation is not visualized. No aortic stenosis is  present.   6. The inferior vena cava is normal in size with greater than 50%  respiratory variability, suggesting right atrial pressure of 3 mmHg.  Physical Exam:   VS:  BP 122/62 (BP Location: Right Arm, Cuff Size: Normal)   Pulse 71   Ht 5\' 5"  (1.651 m)   Wt 173 lb (78.5 kg)   SpO2 98%   BMI 28.79 kg/m    Wt Readings from Last 3 Encounters:  07/23/23 173 lb (78.5 kg)  04/24/23 173 lb 3.2 oz (78.6 kg)  02/08/22  183 lb (83 kg)    GEN: Well nourished, well developed in no acute distress NECK: No JVD; No carotid bruits CARDIAC: S1/S2, RRR, Grade 2/6 murmur, no rubs, no gallops RESPIRATORY:  Clear to auscultation without rales, wheezing or rhonchi  ABDOMEN: Soft, non-tender, non-distended EXTREMITIES:  No edema; No deformity   ASSESSMENT AND PLAN: .    CAD, s/p CABG in 2010, chest pain of uncertain etiology Etiology unclear and multifactorial.  Did  discuss medication management with patient and came to shared medical decision that we will continue to monitor at this time.  I am referring him to pulmonology-see below. No indication for ischemic evaluation. Continue ASA, Lopressor, Simvastatin, and NTG PRN.  Will refill nitroglycerin.  Heart healthy diet and regular cardiovascular exercise encouraged.  Care and ED precautions discussed.  If symptoms do not improve by next office visit, plan to consider Ranexa/low dose Amlodipine based on his description of symptoms.  Imdur would not be a good option as he admits to headache at times.  Mixed hyperlipidemia No recent labs on file. Continue simvastatin. Being managed by PCP. Heart healthy diet and regular cardiovascular exercise encouraged.  Plan to request labs at next office visit from PCP's office.  HTN BP stable. Discussed to monitor BP at home at least 2 hours after medications and sitting for 5-10 minutes. Continue current medication regimen. Heart healthy diet and regular cardiovascular exercise encouraged.   Fatigue, Anemia/thrombocytopenia Most recent labs revealed Hgb 7.9 with platelet count 98, would explain his fatigue.  Patient confirms that he has been told by previous provider that he may need to have his iron levels checked.  Denies any overt bleeding issues.  Does state he has some minimal blood come out with straining during a bowel movement.  Recommended follow-up with PCP regarding this.  Will obtain CBC, CMET, thyroid panel, B12, folate panel, and iron panel for further evaluation.  Referring to pulmonology due to suspected sleep apnea.  Continue follow-up with PCP.  Care and ED precautions discussed.  5. Suspected sleep apnea Mildly elevated PASP noted on echocardiogram in December 2024.  STOP-BANG score 6.  Will refer to pulmonology for OSA evaluation.   Dispo: Follow-up with me/APP in 4-6 weeks or sooner if anything changes.   Signed, Sharlene Dory, NP

## 2023-07-24 ENCOUNTER — Telehealth: Payer: Self-pay | Admitting: Nurse Practitioner

## 2023-07-24 DIAGNOSIS — I2510 Atherosclerotic heart disease of native coronary artery without angina pectoris: Secondary | ICD-10-CM | POA: Diagnosis not present

## 2023-07-24 DIAGNOSIS — R5383 Other fatigue: Secondary | ICD-10-CM | POA: Diagnosis not present

## 2023-07-24 DIAGNOSIS — D649 Anemia, unspecified: Secondary | ICD-10-CM | POA: Diagnosis not present

## 2023-07-24 DIAGNOSIS — I25119 Atherosclerotic heart disease of native coronary artery with unspecified angina pectoris: Secondary | ICD-10-CM | POA: Diagnosis not present

## 2023-07-24 DIAGNOSIS — D696 Thrombocytopenia, unspecified: Secondary | ICD-10-CM | POA: Diagnosis not present

## 2023-07-24 NOTE — Telephone Encounter (Signed)
-----   Message from Sharlene Dory sent at 07/24/2023  9:21 AM EDT ----- Please let patient know that I have referred him to Pulmonology as we discussed in office yesterday. I put the order in under Dr. Vassie Loll. I believe he has sleep apnea. I discussed this with him yesterday, so he should be aware.   Thanks!   Best, Sharlene Dory, NP

## 2023-07-24 NOTE — Telephone Encounter (Signed)
 Patient informed and verbalized understanding of plan.

## 2023-07-28 DIAGNOSIS — Z6828 Body mass index (BMI) 28.0-28.9, adult: Secondary | ICD-10-CM | POA: Diagnosis not present

## 2023-07-28 DIAGNOSIS — D509 Iron deficiency anemia, unspecified: Secondary | ICD-10-CM | POA: Diagnosis not present

## 2023-07-28 DIAGNOSIS — I10 Essential (primary) hypertension: Secondary | ICD-10-CM | POA: Diagnosis not present

## 2023-07-28 DIAGNOSIS — D696 Thrombocytopenia, unspecified: Secondary | ICD-10-CM | POA: Diagnosis not present

## 2023-07-28 DIAGNOSIS — R5383 Other fatigue: Secondary | ICD-10-CM | POA: Diagnosis not present

## 2023-08-02 ENCOUNTER — Encounter: Payer: Self-pay | Admitting: *Deleted

## 2023-08-07 DIAGNOSIS — D649 Anemia, unspecified: Secondary | ICD-10-CM | POA: Diagnosis not present

## 2023-08-09 NOTE — Progress Notes (Unsigned)
 Referring Provider: Estanislado Pandy, MD Primary Care Physician:  Estanislado Pandy, MD Primary Gastroenterologist:  Dr. Marletta Lor  Chief Complaint  Patient presents with   Anemia    Low iron     HPI:   Kyle Boyd is a 73 y.o. male presenting today at the request of Sasser, Clarene Critchley, MD for IDA.    Reviewed labs completed 07/24/23: Hgb 6.4 (L) Iron 24 (L), iron saturation 5% (L) B12 346 Folate 6.6 Cr wnl.   OV with Dr. Neita Carp 3/22 with recommendations to continue iron x 2 per day, hold baby aspirin for now, and see GI for work-up.    Hgb 7.9 in December 2024.   Last labs prior to this from 2010 with Hgb in the 8-9 range which was during admission when patient underwent CABG.    Last colonoscopy 10/30/19 with Dr. Marcha Solders. I am not able to review surgical report or pathology. Per OV follow-up with Dr. Marcha Solders patient had adenomatous colon polyp with recommendation to repeat in 5 years.    Today:  Reports he has been feeling fatigued all winter. Lightheaded only when getting up too quickly, but only happens once in a while. Only short of breath if cold and windy. No heart palpitations or chest pain.  Taking iron twice a day.  Started in March.  Hasn't received any blood transfusion.  States he had repeat blood work with Dr. Neita Carp on 4/1 and was told that his hemoglobin was still low.  No constipation or diarrhea. Occasional bright red blood on the toilet tissue if passing a larger stool. This isn't routine. No rectal pain or known hemorrhoids. Stools have been dark since starting iron. Prior to this, stool was normal in color.   No abdominal pain, nausea, vomiting. Rare heartburn. Rare dysphagia. Occurs with meats and breads. Has cleft palate. Dysphagia has been off and on for years.  Weight fluctuates up and down within a few pounds.  Blood thinner/antiplatelet: None.  NSAIDs:Used to take 81 mg aspirin, but stopped it last month per Dr. Neita Carp. No other NSAIDs.    Tobacco: None  since 2008.  ETOH: None.   Past Medical History:  Diagnosis Date   CAD (coronary artery disease)    Multivessel status post CABG 2010   Carotid artery disease (HCC)    Cleft palate    s/p repair   Erectile dysfunction    Essential hypertension     Past Surgical History:  Procedure Laterality Date   CATARACT EXTRACTION W/PHACO Left 01/09/2020   Procedure: CATARACT EXTRACTION PHACO AND INTRAOCULAR LENS PLACEMENT (IOC);  Surgeon: Fabio Pierce, MD;  Location: [facility];  Service: Ophthalmology;  Laterality: Left;  CDE: 6.99   CATARACT EXTRACTION W/PHACO Right 01/23/2020   Procedure: CATARACT EXTRACTION PHACO AND INTRAOCULAR LENS PLACEMENT RIGHT EYE;  Surgeon: Fabio Pierce, MD;  Location: [facility];  Service: Ophthalmology;  Laterality: Right;  CDE: 6.11   CLEFT PALATE REPAIR     CORONARY ARTERY BYPASS GRAFT  02/2009   Dr. Donata Clay - LIMA to LAD, SVG to diagonal, SVG to OM, SVG to RCA   plastic ear drum      right    Current Outpatient Medications  Medication Sig Dispense Refill   acetaminophen (TYLENOL) 500 MG tablet Take 500 mg by mouth every 6 (six) hours as needed for moderate pain.     ferrous sulfate 324 MG TBEC Take 324 mg by mouth. Twice daily     metoprolol tartrate (LOPRESSOR) 25  MG tablet Take 1 tablet (25 mg total) by mouth 2 (two) times daily. 60 tablet 6   nitroGLYCERIN (NITROSTAT) 0.4 MG SL tablet Place 1 tablet (0.4 mg total) under the tongue every 5 (five) minutes x 3 doses as needed. If no relief after 3rd dose, proceed to the ED for an evaluation 25 tablet 3   simvastatin (ZOCOR) 40 MG tablet Take 40 mg by mouth at bedtime.     tobramycin (TOBREX) 0.3 % ophthalmic solution 3 drops 3 (three) times daily.     aspirin EC 81 MG tablet Take 1 tablet (81 mg total) by mouth daily. (Patient not taking: Reported on 08/10/2023) 90 tablet 3   No current facility-administered medications for this visit.    Allergies as of 08/10/2023   (No Known Allergies)    Family History   Problem Relation Age of Onset   Heart disease Mother    Colon cancer Neg Hx    Stomach cancer Neg Hx     Social History   Socioeconomic History   Marital status: Married    Spouse name: Not on file   Number of children: 2   Years of education: Not on file   Highest education level: Not on file  Occupational History   Occupation: full time    Comment: Cabin crew facility in [place]  Tobacco Use   Smoking status: Never   Smokeless tobacco: Never  Vaping Use   Vaping status: Never Used  Substance and Sexual Activity   Alcohol use: No    Alcohol/week: 0.0 standard drinks of alcohol   Drug use: No   Sexual activity: Not on file  Other Topics Concern   Not on file  Social History Narrative   Not on file   Social Drivers of Health   Financial Resource Strain: Not on file  Food Insecurity: Not on file  Transportation Needs: Not on file  Physical Activity: Not on file  Stress: Not on file  Social Connections: Not on file  Intimate Partner Violence: Not on file    Review of Systems: Gen: Denies any fever, chills, cold or flulike symptoms, presyncope, syncope. CV: Denies chest pain, heart palpitations. Resp: Denies shortness of breath or cough. GI: See HPI GU : Denies urinary burning, urinary frequency, urinary hesitancy MS: Denies joint pain. Derm: Denies rash. Psych: Denies depression, anxiety. Heme: See HPI  Physical Exam: BP (!) 128/57 (BP Location: Right Arm, Patient Position: Sitting, Cuff Size: Normal)   Pulse 63   Temp 98.1 F (36.7 C) (Temporal)   Ht 5\' 5"  (1.651 m)   Wt 175 lb (79.4 kg)   BMI 29.12 kg/m  General:   Alert and oriented. Pleasant and cooperative. Well-nourished and well-developed. Pale complexion.  Head:  Normocephalic and atraumatic. Eyes:  Without icterus, sclera clear and conjunctiva pink.  Ears:  Normal auditory acuity. Lungs:  Clear to auscultation bilaterally. No wheezes, rales, or rhonchi. No distress.  Heart:  S1,  S2 present without murmurs appreciated.  Abdomen:  +BS, soft, non-tender and non-distended. No HSM noted. No guarding or rebound. No masses appreciated.  Rectal:  Deferred  Msk:  Symmetrical without gross deformities. Normal posture. Extremities:  Without edema. Neurologic:  Alert and  oriented x4;  grossly normal neurologically. Skin:  Intact without significant lesions or rashes. Psych: Normal mood and affect.    Assessment:  73 year old male with history of CAD s/p multi vessel CABG in 2010, carotid artery disease, HTN, cleft palate s/p repair, anemia, presenting today  at the request of Dr. Neita Carp for further evaluation of IDA.  Per chart review, patient has had chronic anemia, but found to have iron deficiency in March, started on oral iron at that time.  Notably, hemoglobin was 6.4 and patient states that he did not receive a blood transfusion.  Reports having repeat labs on 4/1 with PCP.  I will request these for review.  Hemoglobin remains below 7, he will need a blood transfusion.   Etiology of IDA is unclear.  He will need to he does report occasional toilet tissue hematochezia if passing large stools, but this does not occur on a regular basis and no other significant GI bleeding.  Last colonoscopy was in June 2021 with Dr. Olegario Messier.  I am unable to see surgical report, but per office visit follow-up, patient had an adenomatous colon polyp and was advised to repeat colonoscopy in 5 years.  He has never had an EGD. Reports occasional intermittent dysphagia off and on for years, but no other significant GI symptoms.  No NSAIDs aside from 81 mg aspirin which was discontinued in March.  Notably, he is mildly symptomatic with fatigue for the last several months, but no SOB or CP.   I have recommended EGD with possible dilation and colonoscopy for further evaluation of IDA, rectal bleeding, dysphagia.  If unrevealing, will need to pursue Givens capsule.   Plan:  Proceed with upper endoscopy  +/- dilation + colonoscopy with propofol by Dr. Marletta Lor in near future. The risks, benefits, and alternatives have been discussed with the patient in detail. The patient states understanding and desires to proceed.  ASA 3 Hold iron x 7 days prior to procedure Request labs from 4/1 from Dr. Neita Carp ASAP. If Hgb remains below 7, he will need a blood tansfusion.  Follow-up after procedures.    Ermalinda Memos, PA-C [facility] 08/10/2023

## 2023-08-09 NOTE — H&P (View-Only) (Signed)
 Referring Provider: Orest Bio, MD Primary Care Physician:  Orest Bio, MD Primary Gastroenterologist:  Dr. Mordechai April  Chief Complaint  Patient presents with   Anemia    Low iron     HPI:   Kyle Boyd is a 73 y.o. male presenting today at the request of Sasser, Ky Phillips, MD for IDA.    Reviewed labs completed 07/24/23: Hgb 6.4 (L) Iron 24 (L), iron saturation 5% (L) B12 346 Folate 6.6 Cr wnl.   OV with Dr. Marykay Snipes 3/22 with recommendations to continue iron x 2 per day, hold baby aspirin  for now, and see GI for work-up.    Hgb 7.9 in December 2024.   Last labs prior to this from 2010 with Hgb in the 8-9 range which was during admission when patient underwent CABG.    Last colonoscopy 10/30/19 with Dr. Louanne Roussel. I am not able to review surgical report or pathology. Per OV follow-up with Dr. Louanne Roussel patient had adenomatous colon polyp with recommendation to repeat in 5 years.    Today:  Reports he has been feeling fatigued all winter. Lightheaded only when getting up too quickly, but only happens once in a while. Only short of breath if cold and windy. No heart palpitations or chest pain.  Taking iron twice a day.  Started in March.  Hasn't received any blood transfusion.  States he had repeat blood work with Dr. Marykay Snipes on 4/1 and was told that his hemoglobin was still low.  No constipation or diarrhea. Occasional bright red blood on the toilet tissue if passing a larger stool. This isn't routine. No rectal pain or known hemorrhoids. Stools have been dark since starting iron. Prior to this, stool was normal in color.   No abdominal pain, nausea, vomiting. Rare heartburn. Rare dysphagia. Occurs with meats and breads. Has cleft palate. Dysphagia has been off and on for years.  Weight fluctuates up and down within a few pounds.  Blood thinner/antiplatelet: None.  NSAIDs:Used to take 81 mg aspirin , but stopped it last month per Dr. Marykay Snipes. No other NSAIDs.    Tobacco: None  since 2008.  ETOH: None.   Past Medical History:  Diagnosis Date   CAD (coronary artery disease)    Multivessel status post CABG 2010   Carotid artery disease (HCC)    Cleft palate    s/p repair   Erectile dysfunction    Essential hypertension     Past Surgical History:  Procedure Laterality Date   CATARACT EXTRACTION W/PHACO Left 01/09/2020   Procedure: CATARACT EXTRACTION PHACO AND INTRAOCULAR LENS PLACEMENT (IOC);  Surgeon: Tarri Farm, MD;  Location: [facility];  Service: Ophthalmology;  Laterality: Left;  CDE: 6.99   CATARACT EXTRACTION W/PHACO Right 01/23/2020   Procedure: CATARACT EXTRACTION PHACO AND INTRAOCULAR LENS PLACEMENT RIGHT EYE;  Surgeon: Tarri Farm, MD;  Location: [facility];  Service: Ophthalmology;  Laterality: Right;  CDE: 6.11   CLEFT PALATE REPAIR     CORONARY ARTERY BYPASS GRAFT  02/2009   Dr. Matt Song - LIMA to LAD, SVG to diagonal, SVG to OM, SVG to RCA   plastic ear drum      right    Current Outpatient Medications  Medication Sig Dispense Refill   acetaminophen (TYLENOL) 500 MG tablet Take 500 mg by mouth every 6 (six) hours as needed for moderate pain.     ferrous sulfate 324 MG TBEC Take 324 mg by mouth. Twice daily     metoprolol  tartrate (LOPRESSOR ) 25  MG tablet Take 1 tablet (25 mg total) by mouth 2 (two) times daily. 60 tablet 6   nitroGLYCERIN  (NITROSTAT ) 0.4 MG SL tablet Place 1 tablet (0.4 mg total) under the tongue every 5 (five) minutes x 3 doses as needed. If no relief after 3rd dose, proceed to the ED for an evaluation 25 tablet 3   simvastatin (ZOCOR) 40 MG tablet Take 40 mg by mouth at bedtime.     tobramycin (TOBREX) 0.3 % ophthalmic solution 3 drops 3 (three) times daily.     aspirin  EC 81 MG tablet Take 1 tablet (81 mg total) by mouth daily. (Patient not taking: Reported on 08/10/2023) 90 tablet 3   No current facility-administered medications for this visit.    Allergies as of 08/10/2023   (No Known Allergies)    Family History   Problem Relation Age of Onset   Heart disease Mother    Colon cancer Neg Hx    Stomach cancer Neg Hx     Social History   Socioeconomic History   Marital status: Married    Spouse name: Not on file   Number of children: 2   Years of education: Not on file   Highest education level: Not on file  Occupational History   Occupation: full time    Comment: Cabin crew facility in [place]   Tobacco Use   Smoking status: Never   Smokeless tobacco: Never  Vaping Use   Vaping status: Never Used  Substance and Sexual Activity   Alcohol  use: No    Alcohol /week: 0.0 standard drinks of alcohol    Drug use: No   Sexual activity: Not on file  Other Topics Concern   Not on file  Social History Narrative   Not on file   Social Drivers of Health   Financial Resource Strain: Not on file  Food Insecurity: Not on file  Transportation Needs: Not on file  Physical Activity: Not on file  Stress: Not on file  Social Connections: Not on file  Intimate Partner Violence: Not on file    Review of Systems: Gen: Denies any fever, chills, cold or flulike symptoms, presyncope, syncope. CV: Denies chest pain, heart palpitations. Resp: Denies shortness of breath or cough. GI: See HPI GU : Denies urinary burning, urinary frequency, urinary hesitancy MS: Denies joint pain. Derm: Denies rash. Psych: Denies depression, anxiety. Heme: See HPI  Physical Exam: BP (!) 128/57 (BP Location: Right Arm, Patient Position: Sitting, Cuff Size: Normal)   Pulse 63   Temp 98.1 F (36.7 C) (Temporal)   Ht 5\' 5"  (1.651 m)   Wt 175 lb (79.4 kg)   BMI 29.12 kg/m  General:   Alert and oriented. Pleasant and cooperative. Well-nourished and well-developed. Pale complexion.  Head:  Normocephalic and atraumatic. Eyes:  Without icterus, sclera clear and conjunctiva pink.  Ears:  Normal auditory acuity. Lungs:  Clear to auscultation bilaterally. No wheezes, rales, or rhonchi. No distress.  Heart:  S1,  S2 present without murmurs appreciated.  Abdomen:  +BS, soft, non-tender and non-distended. No HSM noted. No guarding or rebound. No masses appreciated.  Rectal:  Deferred  Msk:  Symmetrical without gross deformities. Normal posture. Extremities:  Without edema. Neurologic:  Alert and  oriented x4;  grossly normal neurologically. Skin:  Intact without significant lesions or rashes. Psych: Normal mood and affect.    Assessment:  73 year old male with history of CAD s/p multi vessel CABG in 2010, carotid artery disease, HTN, cleft palate s/p repair, anemia, presenting today  at the request of Dr. Marykay Snipes for further evaluation of IDA.  Per chart review, patient has had chronic anemia, but found to have iron deficiency in March, started on oral iron at that time.  Notably, hemoglobin was 6.4 and patient states that he did not receive a blood transfusion.  Reports having repeat labs on 4/1 with PCP.  I will request these for review.  Hemoglobin remains below 7, he will need a blood transfusion.   Etiology of IDA is unclear.  He will need to he does report occasional toilet tissue hematochezia if passing large stools, but this does not occur on a regular basis and no other significant GI bleeding.  Last colonoscopy was in June 2021 with Dr. Thersia Flax.  I am unable to see surgical report, but per office visit follow-up, patient had an adenomatous colon polyp and was advised to repeat colonoscopy in 5 years.  He has never had an EGD. Reports occasional intermittent dysphagia off and on for years, but no other significant GI symptoms.  No NSAIDs aside from 81 mg aspirin  which was discontinued in March.  Notably, he is mildly symptomatic with fatigue for the last several months, but no SOB or CP.   I have recommended EGD with possible dilation and colonoscopy for further evaluation of IDA, rectal bleeding, dysphagia.  If unrevealing, will need to pursue Givens capsule.   Plan:  Proceed with upper endoscopy  +/- dilation + colonoscopy with propofol  by Dr. Mordechai April in near future. The risks, benefits, and alternatives have been discussed with the patient in detail. The patient states understanding and desires to proceed.  ASA 3 Hold iron x 7 days prior to procedure Request labs from 4/1 from Dr. Marykay Snipes ASAP. If Hgb remains below 7, he will need a blood tansfusion.  Follow-up after procedures.    Shana Daring, PA-C [facility] 08/10/2023

## 2023-08-10 ENCOUNTER — Ambulatory Visit: Admitting: Gastroenterology

## 2023-08-10 ENCOUNTER — Encounter: Payer: Self-pay | Admitting: *Deleted

## 2023-08-10 ENCOUNTER — Other Ambulatory Visit: Payer: Self-pay | Admitting: *Deleted

## 2023-08-10 ENCOUNTER — Telehealth: Payer: Self-pay | Admitting: *Deleted

## 2023-08-10 ENCOUNTER — Encounter: Payer: Self-pay | Admitting: Gastroenterology

## 2023-08-10 VITALS — BP 128/57 | HR 63 | Temp 98.1°F | Ht 65.0 in | Wt 175.0 lb

## 2023-08-10 DIAGNOSIS — Z860100 Personal history of colon polyps, unspecified: Secondary | ICD-10-CM

## 2023-08-10 DIAGNOSIS — R1310 Dysphagia, unspecified: Secondary | ICD-10-CM

## 2023-08-10 DIAGNOSIS — D509 Iron deficiency anemia, unspecified: Secondary | ICD-10-CM

## 2023-08-10 DIAGNOSIS — K625 Hemorrhage of anus and rectum: Secondary | ICD-10-CM

## 2023-08-10 MED ORDER — PEG 3350-KCL-NA BICARB-NACL 420 G PO SOLR: 4000.0000 mL | Freq: Once | ORAL | 0 refills | Status: AC

## 2023-08-10 NOTE — Telephone Encounter (Signed)
 LMOVM to return call.  TCS/EGD+/-ED, ASA 3 w/Dr.Carver Hold iron x 7 days

## 2023-08-10 NOTE — Patient Instructions (Addendum)
 We will get you scheduled for a colonoscopy and upper endoscopy with possible stretching of your esophagus with Dr. Marletta Lor at [facility].  You will need to hold iron for 7 days prior to your procedures.  I am requesting your recent blood work from Dr. Neita Carp.  We will follow-up with you in the office after your procedures.  Ermalinda Memos, PA-C [facility]

## 2023-08-10 NOTE — Telephone Encounter (Signed)
 Pt has been scheduled for 09/03/23. Instructions mailed and prep sent to the pharmacy

## 2023-08-14 ENCOUNTER — Encounter: Payer: Self-pay | Admitting: Gastroenterology

## 2023-08-15 DIAGNOSIS — I700 Atherosclerosis of aorta: Secondary | ICD-10-CM | POA: Diagnosis not present

## 2023-08-15 DIAGNOSIS — D649 Anemia, unspecified: Secondary | ICD-10-CM | POA: Diagnosis not present

## 2023-08-15 DIAGNOSIS — I2510 Atherosclerotic heart disease of native coronary artery without angina pectoris: Secondary | ICD-10-CM | POA: Diagnosis not present

## 2023-08-15 DIAGNOSIS — Z6828 Body mass index (BMI) 28.0-28.9, adult: Secondary | ICD-10-CM | POA: Diagnosis not present

## 2023-08-19 ENCOUNTER — Telehealth: Payer: Self-pay | Admitting: Gastroenterology

## 2023-08-19 NOTE — Telephone Encounter (Signed)
 Reviewed las from PCP 08/07/23.  Hgb low but improved to 8.5. Platelets low at 62.   He has chronically low platelets. I am not sure why. I would recommend we obtain an abdominal US  to take a look at his liver and spleen. Also, does he see a hematologist for his low platelets?   Tammy: Can you arrange abdominal US ? Dx: thrombocytopenia.

## 2023-08-22 ENCOUNTER — Other Ambulatory Visit: Payer: Self-pay | Admitting: *Deleted

## 2023-08-22 DIAGNOSIS — D696 Thrombocytopenia, unspecified: Secondary | ICD-10-CM

## 2023-08-22 NOTE — Telephone Encounter (Signed)
 Noted.

## 2023-08-22 NOTE — Telephone Encounter (Signed)
 OK. Lets get the US  and will decide on referral to hematology thereafter.

## 2023-08-22 NOTE — Telephone Encounter (Signed)
 Spoke to pt, informed him of results and recommendations. Pt voiced understanding. Pt states he does not see a hematologist.

## 2023-08-22 NOTE — Telephone Encounter (Signed)
 Pt informed of providers recommendations. Informed that US  is scheduled for Wednesday 09/05/23, arrive at 9:15 am to check in, NPO after midnight.

## 2023-08-28 NOTE — Patient Instructions (Signed)
 Kyle Boyd  08/28/2023     @PREFPERIOPPHARMACY @   Your procedure is scheduled on  09/03/2023.   Report to [facility] at  0915  A.M.   Call this number if you have problems the morning of surgery:  [phone]  If you experience any cold or flu symptoms such as cough, fever, chills, shortness of breath, etc. between now and your scheduled surgery, please notify us  at the above number.   Remember:  Follow the diet and prep instructions given to you by the office.   You may drink clear liquids until 0715 am on 09/03/2023.    Clear liquids allowed are:                    Water, Juice (No red color; non-citric and without pulp; diabetics please choose diet or no sugar options), Carbonated beverages (diabetics please choose diet or no sugar options), Clear Tea (No creamer, milk, or cream, including half & half and powdered creamer), Black Coffee Only (No creamer, milk or cream, including half & half and powdered creamer), and Clear Sports drink (No red color; diabetics please choose diet or no sugar options)    Take these medicines the morning of surgery with A SIP OF WATER                                                    metoprolol .    Do not wear jewelry, make-up or nail polish, including gel polish,  artificial nails, or any other type of covering on natural nails (fingers and  toes).  Do not wear lotions, powders, or perfumes, or deodorant.  Do not shave 48 hours prior to surgery.  Men may shave face and neck.  Do not bring valuables to the hospital.  [facility] is not responsible for any belongings or valuables.  Contacts, dentures or bridgework may not be worn into surgery.  Leave your suitcase in the car.  After surgery it may be brought to your room.  For patients admitted to the hospital, discharge time will be determined by your treatment team.  Patients discharged the day of surgery will not be allowed to drive home and must have someone with them for  24 hours.    Special instructions:   DO NOT smoke tobacco or vape for 24 hours before your procedure.  Please read over the following fact sheets that you were given. Anesthesia Post-op Instructions and Care and Recovery After Surgery      Upper Endoscopy, Adult, Care After After the procedure, it is common to have a sore throat. It is also common to have: Mild stomach pain or discomfort. Bloating. Nausea. Follow these instructions at home: The instructions below may help you care for yourself at home. Your health care provider may give you more instructions. If you have questions, ask your health care provider. If you were given a sedative during the procedure, it can affect you for several hours. Do not drive or operate machinery until your health care provider says that it is safe. If you will be going home right after the procedure, plan to have a responsible adult: Take you home from the hospital or clinic. You will not be allowed to drive. Care for you for the time you are told. Follow  instructions from your health care provider about what you may eat and drink. Return to your normal activities as told by your health care provider. Ask your health care provider what activities are safe for you. Take over-the-counter and prescription medicines only as told by your health care provider. Contact a health care provider if you: Have a sore throat that lasts longer than one day. Have trouble swallowing. Have a fever. Get help right away if you: Vomit blood or your vomit looks like coffee grounds. Have bloody, black, or tarry stools. Have a very bad sore throat or you cannot swallow. Have difficulty breathing or very bad pain in your chest or abdomen. These symptoms may be an emergency. Get help right away. Call 911. Do not wait to see if the symptoms will go away. Do not drive yourself to the hospital. Summary After the procedure, it is common to have a sore throat, mild stomach  discomfort, bloating, and nausea. If you were given a sedative during the procedure, it can affect you for several hours. Do not drive until your health care provider says that it is safe. Follow instructions from your health care provider about what you may eat and drink. Return to your normal activities as told by your health care provider. This information is not intended to replace advice given to you by your health care provider. Make sure you discuss any questions you have with your health care provider. Document Revised: 08/03/2021 Document Reviewed: 08/03/2021 Elsevier Patient Education  2024 [employer].Colonoscopy, Adult, Care After The following information offers guidance on how to care for yourself after your procedure. Your health care provider may also give you more specific instructions. If you have problems or questions, contact your health care provider. What can I expect after the procedure? After the procedure, it is common to have: A small amount of blood in your stool for 24 hours after the procedure. Some gas. Mild cramping or bloating of your abdomen. Follow these instructions at home: Eating and drinking  Drink enough fluid to keep your urine pale yellow. Follow instructions from your health care provider about eating or drinking restrictions. Resume your normal diet as told by your health care provider. Avoid heavy or fried foods that are hard to digest. Activity Rest as told by your health care provider. Avoid sitting for a long time without moving. Get up to take short walks every 1-2 hours. This is important to improve blood flow and breathing. Ask for help if you feel weak or unsteady. Return to your normal activities as told by your health care provider. Ask your health care provider what activities are safe for you. Managing cramping and bloating  Try walking around when you have cramps or feel bloated. If directed, apply heat to your abdomen as told by  your health care provider. Use the heat source that your health care provider recommends, such as a moist heat pack or a heating pad. Place a towel between your skin and the heat source. Leave the heat on for 20-30 minutes. Remove the heat if your skin turns bright red. This is especially important if you are unable to feel pain, heat, or cold. You have a greater risk of getting burned. General instructions If you were given a sedative during the procedure, it can affect you for several hours. Do not drive or operate machinery until your health care provider says that it is safe. For the first 24 hours after the procedure: Do not sign important  documents. Do not drink alcohol . Do your regular daily activities at a slower pace than normal. Eat soft foods that are easy to digest. Take over-the-counter and prescription medicines only as told by your health care provider. Keep all follow-up visits. This is important. Contact a health care provider if: You have blood in your stool 2-3 days after the procedure. Get help right away if: You have more than a small spotting of blood in your stool. You have large blood clots in your stool. You have swelling of your abdomen. You have nausea or vomiting. You have a fever. You have increasing pain in your abdomen that is not relieved with medicine. These symptoms may be an emergency. Get help right away. Call 911. Do not wait to see if the symptoms will go away. Do not drive yourself to the hospital. Summary After the procedure, it is common to have a small amount of blood in your stool. You may also have mild cramping and bloating of your abdomen. If you were given a sedative during the procedure, it can affect you for several hours. Do not drive or operate machinery until your health care provider says that it is safe. Get help right away if you have a lot of blood in your stool, nausea or vomiting, a fever, or increased pain in your abdomen. This  information is not intended to replace advice given to you by your health care provider. Make sure you discuss any questions you have with your health care provider. Document Revised: 06/06/2022 Document Reviewed: 12/15/2020 Elsevier Patient Education  2024 Else[employer] Inc.General Anesthesia, Adult, Care After The following information offers guidance on how to care for yourself after your procedure. Your health care provider may also give you more specific instructions. If you have problems or questions, contact your health care provider. What can I expect after the procedure? After the procedure, it is common for people to: Have pain or discomfort at the IV site. Have nausea or vomiting. Have a sore throat or hoarseness. Have trouble concentrating. Feel cold or chills. Feel weak, sleepy, or tired (fatigue). Have soreness and body aches. These can affect parts of the body that were not involved in surgery. Follow these instructions at home: For the time period you were told by your health care provider:  Rest. Do not participate in activities where you could fall or become injured. Do not drive or use machinery. Do not drink alcohol . Do not take sleeping pills or medicines that cause drowsiness. Do not make important decisions or sign legal documents. Do not take care of children on your own. General instructions Drink enough fluid to keep your urine pale yellow. If you have sleep apnea, surgery and certain medicines can increase your risk for breathing problems. Follow instructions from your health care provider about wearing your sleep device: Anytime you are sleeping, including during daytime naps. While taking prescription pain medicines, sleeping medicines, or medicines that make you drowsy. Return to your normal activities as told by your health care provider. Ask your health care provider what activities are safe for you. Take over-the-counter and prescription medicines only as  told by your health care provider. Do not use any products that contain nicotine or tobacco. These products include cigarettes, chewing tobacco, and vaping devices, such as e-cigarettes. These can delay incision healing after surgery. If you need help quitting, ask your health care provider. Contact a health care provider if: You have nausea or vomiting that does not get better with medicine.  You vomit every time you eat or drink. You have pain that does not get better with medicine. You cannot urinate or have bloody urine. You develop a skin rash. You have a fever. Get help right away if: You have trouble breathing. You have chest pain. You vomit blood. These symptoms may be an emergency. Get help right away. Call 911. Do not wait to see if the symptoms will go away. Do not drive yourself to the hospital. Summary After the procedure, it is common to have a sore throat, hoarseness, nausea, vomiting, or to feel weak, sleepy, or fatigue. For the time period you were told by your health care provider, do not drive or use machinery. Get help right away if you have difficulty breathing, have chest pain, or vomit blood. These symptoms may be an emergency. This information is not intended to replace advice given to you by your health care provider. Make sure you discuss any questions you have with your health care provider. Document Revised: 07/22/2021 Document Reviewed: 07/22/2021 Elsevier Patient Education  2024 [employer].

## 2023-08-29 ENCOUNTER — Encounter (HOSPITAL_COMMUNITY)
Admission: RE | Admit: 2023-08-29 | Discharge: 2023-08-29 | Disposition: A | Discharge status: Home / Self Care | Source: Ambulatory Visit | Attending: Internal Medicine | Admitting: Internal Medicine

## 2023-08-29 ENCOUNTER — Encounter (HOSPITAL_COMMUNITY): Payer: Self-pay

## 2023-08-29 VITALS — BP 128/57 | HR 63 | Temp 98.1°F | Resp 18 | Ht 65.0 in | Wt 175.0 lb

## 2023-08-29 DIAGNOSIS — D509 Iron deficiency anemia, unspecified: Secondary | ICD-10-CM | POA: Diagnosis not present

## 2023-08-29 DIAGNOSIS — Z01812 Encounter for preprocedural laboratory examination: Secondary | ICD-10-CM | POA: Diagnosis not present

## 2023-08-29 HISTORY — DX: Other specified postprocedural states: Z98.890

## 2023-08-29 LAB — CBC WITH DIFFERENTIAL/PLATELET
Abs Immature Granulocytes: 0 10*3/uL (ref 0.00–0.07)
Basophils Absolute: 0.1 10*3/uL (ref 0.0–0.1)
Basophils Relative: 2 %
Eosinophils Absolute: 0.4 10*3/uL (ref 0.0–0.5)
Eosinophils Relative: 11 %
HCT: 40.6 % (ref 39.0–52.0)
Hemoglobin: 12.5 g/dL — ABNORMAL LOW (ref 13.0–17.0)
Immature Granulocytes: 0 %
Lymphocytes Relative: 26 %
Lymphs Abs: 0.9 10*3/uL (ref 0.7–4.0)
MCH: 29.3 pg (ref 26.0–34.0)
MCHC: 30.8 g/dL (ref 30.0–36.0)
MCV: 95.1 fL (ref 80.0–100.0)
Monocytes Absolute: 0.3 10*3/uL (ref 0.1–1.0)
Monocytes Relative: 10 %
Neutro Abs: 1.7 10*3/uL (ref 1.7–7.7)
Neutrophils Relative %: 51 %
Platelets: 72 10*3/uL — ABNORMAL LOW (ref 150–400)
RBC: 4.27 MIL/uL (ref 4.22–5.81)
RDW: 22.6 % — ABNORMAL HIGH (ref 11.5–15.5)
WBC: 3.3 10*3/uL — ABNORMAL LOW (ref 4.0–10.5)
nRBC: 0 % (ref 0.0–0.2)

## 2023-08-30 ENCOUNTER — Ambulatory Visit: Admitting: Nurse Practitioner

## 2023-09-03 ENCOUNTER — Ambulatory Visit (HOSPITAL_COMMUNITY): Admitting: Anesthesiology

## 2023-09-03 ENCOUNTER — Ambulatory Visit (HOSPITAL_COMMUNITY)
Admission: RE | Admit: 2023-09-03 | Discharge: 2023-09-03 | Disposition: A | Discharge status: Home / Self Care | Attending: Internal Medicine | Admitting: Internal Medicine

## 2023-09-03 ENCOUNTER — Encounter (HOSPITAL_COMMUNITY)
Admission: RE | Disposition: A | Discharge status: Home / Self Care | Payer: Self-pay | Source: Home / Self Care | Attending: Internal Medicine

## 2023-09-03 ENCOUNTER — Encounter (HOSPITAL_COMMUNITY): Payer: Self-pay | Admitting: Internal Medicine

## 2023-09-03 DIAGNOSIS — K2950 Unspecified chronic gastritis without bleeding: Secondary | ICD-10-CM | POA: Diagnosis not present

## 2023-09-03 DIAGNOSIS — R0989 Other specified symptoms and signs involving the circulatory and respiratory systems: Secondary | ICD-10-CM | POA: Insufficient documentation

## 2023-09-03 DIAGNOSIS — I8510 Secondary esophageal varices without bleeding: Secondary | ICD-10-CM | POA: Insufficient documentation

## 2023-09-03 DIAGNOSIS — I8500 Esophageal varices without bleeding: Secondary | ICD-10-CM

## 2023-09-03 DIAGNOSIS — K3189 Other diseases of stomach and duodenum: Secondary | ICD-10-CM

## 2023-09-03 DIAGNOSIS — D124 Benign neoplasm of descending colon: Secondary | ICD-10-CM

## 2023-09-03 DIAGNOSIS — R1310 Dysphagia, unspecified: Secondary | ICD-10-CM | POA: Insufficient documentation

## 2023-09-03 DIAGNOSIS — Z7982 Long term (current) use of aspirin: Secondary | ICD-10-CM | POA: Insufficient documentation

## 2023-09-03 DIAGNOSIS — Z79899 Other long term (current) drug therapy: Secondary | ICD-10-CM | POA: Insufficient documentation

## 2023-09-03 DIAGNOSIS — D120 Benign neoplasm of cecum: Secondary | ICD-10-CM

## 2023-09-03 DIAGNOSIS — D509 Iron deficiency anemia, unspecified: Secondary | ICD-10-CM | POA: Insufficient documentation

## 2023-09-03 DIAGNOSIS — K648 Other hemorrhoids: Secondary | ICD-10-CM | POA: Diagnosis not present

## 2023-09-03 DIAGNOSIS — I2510 Atherosclerotic heart disease of native coronary artery without angina pectoris: Secondary | ICD-10-CM | POA: Diagnosis not present

## 2023-09-03 DIAGNOSIS — K635 Polyp of colon: Secondary | ICD-10-CM

## 2023-09-03 DIAGNOSIS — I10 Essential (primary) hypertension: Secondary | ICD-10-CM | POA: Diagnosis not present

## 2023-09-03 DIAGNOSIS — K766 Portal hypertension: Secondary | ICD-10-CM | POA: Diagnosis not present

## 2023-09-03 DIAGNOSIS — K625 Hemorrhage of anus and rectum: Secondary | ICD-10-CM | POA: Diagnosis not present

## 2023-09-03 DIAGNOSIS — R0602 Shortness of breath: Secondary | ICD-10-CM | POA: Diagnosis not present

## 2023-09-03 DIAGNOSIS — K2951 Unspecified chronic gastritis with bleeding: Secondary | ICD-10-CM | POA: Diagnosis not present

## 2023-09-03 HISTORY — PX: COLONOSCOPY: SHX5424

## 2023-09-03 HISTORY — PX: ESOPHAGOGASTRODUODENOSCOPY: SHX5428

## 2023-09-03 SURGERY — COLONOSCOPY
Anesthesia: General

## 2023-09-03 MED ORDER — PROPOFOL 10 MG/ML IV BOLUS
INTRAVENOUS | Status: DC | PRN
  Administered 2023-09-03: 100 mg via INTRAVENOUS

## 2023-09-03 MED ORDER — PROPOFOL 500 MG/50ML IV EMUL
INTRAVENOUS | Status: DC | PRN
  Administered 2023-09-03: 200 ug/kg/min via INTRAVENOUS

## 2023-09-03 MED ORDER — LIDOCAINE 2% (20 MG/ML) 5 ML SYRINGE
INTRAMUSCULAR | Status: DC | PRN
  Administered 2023-09-03: 100 mg via INTRAVENOUS

## 2023-09-03 MED ORDER — STERILE WATER FOR IRRIGATION IR SOLN
Status: DC | PRN
  Administered 2023-09-03: 100 mL

## 2023-09-03 MED ORDER — LACTATED RINGERS IV SOLN
INTRAVENOUS | Status: DC | PRN
Start: 2023-09-03 — End: 2023-09-03

## 2023-09-03 NOTE — Discharge Instructions (Addendum)
 EGD Discharge instructions Please read the instructions outlined below and refer to this sheet in the next few weeks. These discharge instructions provide you with general information on caring for yourself after you leave the hospital. Your doctor may also give you specific instructions. While your treatment has been planned according to the most current medical practices available, unavoidable complications occasionally occur. If you have any problems or questions after discharge, please call your doctor. ACTIVITY You may resume your regular activity but move at a slower pace for the next 24 hours.  Take frequent rest periods for the next 24 hours.  Walking will help expel (get rid of) the air and reduce the bloated feeling in your abdomen.  No driving for 24 hours (because of the anesthesia (medicine) used during the test).  You may shower.  Do not sign any important legal documents or operate any machinery for 24 hours (because of the anesthesia used during the test).  NUTRITION Drink plenty of fluids.  You may resume your normal diet.  Begin with a light meal and progress to your normal diet.  Avoid alcoholic beverages for 24 hours or as instructed by your caregiver.  MEDICATIONS You may resume your normal medications unless your caregiver tells you otherwise.  WHAT YOU CAN EXPECT TODAY You may experience abdominal discomfort such as a feeling of fullness or "gas" pains.  FOLLOW-UP Your doctor will discuss the results of your test with you.  SEEK IMMEDIATE MEDICAL ATTENTION IF ANY OF THE FOLLOWING OCCUR: Excessive nausea (feeling sick to your stomach) and/or vomiting.  Severe abdominal pain and distention (swelling).  Trouble swallowing.  Temperature over 101 F (37.8 C).  Rectal bleeding or vomiting of blood.      Colonoscopy Discharge Instructions  Read the instructions outlined below and refer to this sheet in the next few weeks. These discharge instructions provide you  with general information on caring for yourself after you leave the hospital. Your doctor may also give you specific instructions. While your treatment has been planned according to the most current medical practices available, unavoidable complications occasionally occur.   ACTIVITY You may resume your regular activity, but move at a slower pace for the next 24 hours.  Take frequent rest periods for the next 24 hours.  Walking will help get rid of the air and reduce the bloated feeling in your belly (abdomen).  No driving for 24 hours (because of the medicine (anesthesia) used during the test).   Do not sign any important legal documents or operate any machinery for 24 hours (because of the anesthesia used during the test).  NUTRITION Drink plenty of fluids.  You may resume your normal diet as instructed by your doctor.  Begin with a light meal and progress to your normal diet. Heavy or fried foods are harder to digest and may make you feel sick to your stomach (nauseated).  Avoid alcoholic beverages for 24 hours or as instructed.  MEDICATIONS You may resume your normal medications unless your doctor tells you otherwise.  WHAT YOU CAN EXPECT TODAY Some feelings of bloating in the abdomen.  Passage of more gas than usual.  Spotting of blood in your stool or on the toilet paper.  IF YOU HAD POLYPS REMOVED DURING THE COLONOSCOPY: No aspirin  products for 7 days or as instructed.  No alcohol  for 7 days or as instructed.  Eat a soft diet for the next 24 hours.  FINDING OUT THE RESULTS OF YOUR TEST Not all test results  are available during your visit. If your test results are not back during the visit, make an appointment with your caregiver to find out the results. Do not assume everything is normal if you have not heard from your caregiver or the medical facility. It is important for you to follow up on all of your test results.  SEEK IMMEDIATE MEDICAL ATTENTION IF: You have more than a  spotting of blood in your stool.  Your belly is swollen (abdominal distention).  You are nauseated or vomiting.  You have a temperature over 101.  You have abdominal pain or discomfort that is severe or gets worse throughout the day.   Your EGD revealed mild amount inflammation in your stomach.  I took biopsies of this to rule out infection with a bacteria called H. pylori.  You also had concerning findings in your stomach and esophagus for liver disease/cirrhosis.  Agree with ultrasound which is scheduled in a few days.  We may need to perform CT imaging if unremarkable.  Continue on oral iron.  Your colonoscopy revealed 3 polyp(s) which I removed successfully. Await pathology results, my office will contact you. I recommend repeating colonoscopy in 5 years for surveillance purposes.   Follow-up in GI office in 3 to 4 weeks.  I hope you have a great rest of your week!  Rolando Cliche. Mordechai April, D.O. Gastroenterology and Hepatology [facility]

## 2023-09-03 NOTE — Op Note (Signed)
 [facility] Patient Name: Kyle Boyd Procedure Date: 09/03/2023 10:47 AM MRN: [id number] Date of Birth: 25-Nov-1950 Attending MD: Rolando Cliche. Roel Clarity, [id number] CSN: [id number] Age: 73 Admit Type: Outpatient Procedure:                Upper GI endoscopy Indications:              Iron deficiency anemia Providers:                Rolando Cliche. Mordechai April, DO, Vonna Guardian, [place] Wilson,                            Technician, Theola Fitch Referring MD:              Medicines:                See the Anesthesia note for documentation of the                            administered medications Complications:            No immediate complications. Estimated Blood Loss:     Estimated blood loss was minimal. Procedure:                Pre-Anesthesia Assessment:                           - The anesthesia plan was to use monitored                            anesthesia care (MAC).                           After obtaining informed consent, the endoscope was                            passed under direct vision. Throughout the                            procedure, the patient's blood pressure, pulse, and                            oxygen saturations were monitored continuously. The                            [id number]-[id number] ([id number]) scope was introduced through the                            mouth, and advanced to the second part of duodenum.                            The upper GI endoscopy was accomplished without                            difficulty. The patient tolerated the procedure                            well. Scope In:  11:01:32 AM Scope Out: 11:05:15 AM Total Procedure Duration: 0 hours 3 minutes 43 seconds  Findings:      One column of grade II varices with no bleeding and no stigmata of       recent bleeding were found in the lower third of the esophagus. No red       wale signs were present.      Mild portal hypertensive gastropathy was found in the entire examined       stomach.       Localized moderate inflammation characterized by erosions, erythema and       shallow ulcerations was found in the gastric antrum. Biopsies were taken       with a cold forceps for Helicobacter pylori testing.      The duodenal bulb, first portion of the duodenum and second portion of       the duodenum were normal. Impression:               - Grade II esophageal varices with no bleeding and                            no stigmata of recent bleeding.                           - Portal hypertensive gastropathy.                           - Gastritis. Biopsied.                           - Normal duodenal bulb, first portion of the                            duodenum and second portion of the duodenum. Moderate Sedation:      Per Anesthesia Care Recommendation:           - Patient has a contact number available for                            emergencies. The signs and symptoms of potential                            delayed complications were discussed with the                            patient. Return to normal activities tomorrow.                            Written discharge instructions were provided to the                            patient.                           - Resume previous diet.                           - Continue present medications.                           -  Await pathology results.                           - Use a proton pump inhibitor PO BID.                           - No ibuprofen, naproxen, or other non-steroidal                            anti-inflammatory drugs.                           - Findings concerning for portal hypertension.                            Needs imaging to evaluate for chronic liver                            disease. If US  unremarkable, consider CT imaging.                            If cirrhosis confirmed, would start on carvedilol                            or NSBB for primary ppx for varices.                           - Return to GI  clinic in 4 weeks. Procedure Code(s):        --- Professional ---                           [phone], Esophagogastroduodenoscopy, flexible,                            transoral; with biopsy, single or multiple Diagnosis Code(s):        --- Professional ---                           I85.00, Esophageal varices without bleeding                           K76.6, Portal hypertension                           K31.89, Other diseases of stomach and duodenum                           K29.70, Gastritis, unspecified, without bleeding                           D50.9, Iron deficiency anemia, unspecified CPT copyright 2022 American Medical Association. All rights reserved. The codes documented in this report are preliminary and upon coder review may  be revised to meet current compliance requirements. Rolando Cliche. Mordechai April, DO Rolando Cliche. Edgerrin Correia, DO 09/03/2023 11:10:00 AM This report has been signed electronically. Number of Addenda: 0

## 2023-09-03 NOTE — Interval H&P Note (Signed)
 History and Physical Interval Note:  09/03/2023 10:48 AM  Kyle Boyd  has presented today for surgery, with the diagnosis of IDA,rectal bleeding,dysphagia.  The various methods of treatment have been discussed with the patient and family. After consideration of risks, benefits and other options for treatment, the patient has consented to  Procedure(s) with comments: COLONOSCOPY (N/A) - 11:15 am, asa 3 EGD (ESOPHAGOGASTRODUODENOSCOPY) (N/A) - 11:15 am, asa 3 as a surgical intervention.  The patient's history has been reviewed, patient examined, no change in status, stable for surgery.  I have reviewed the patient's chart and labs.  Questions were answered to the patient's satisfaction.     [name]

## 2023-09-03 NOTE — Op Note (Signed)
 [facility] Patient Name: Kyle Boyd Procedure Date: 09/03/2023 10:44 AM MRN: [id number] Date of Birth: 02/02/1951 Attending MD: Rolando Cliche. Roel Clarity, [id number] CSN: [id number] Age: 73 Admit Type: Outpatient Procedure:                Colonoscopy Indications:              Iron deficiency anemia Providers:                Rolando Cliche. Mordechai April, DO, Vonna Guardian, [place] Wilson,                            Technician, Theola Fitch Referring MD:              Medicines:                See the Anesthesia note for documentation of the                            administered medications Complications:            No immediate complications. Estimated Blood Loss:     Estimated blood loss was minimal. Procedure:                Pre-Anesthesia Assessment:                           - The anesthesia plan was to use monitored                            anesthesia care (MAC).                           After obtaining informed consent, the colonoscope                            was passed under direct vision. Throughout the                            procedure, the patient's blood pressure, pulse, and                            oxygen saturations were monitored continuously. The                            [id number]-HQ190L ([id number]) scope was introduced through                            the anus and advanced to the the terminal ileum,                            with identification of the appendiceal orifice and                            IC valve. The colonoscopy was performed without                            difficulty. The patient tolerated the  procedure                            well. The quality of the bowel preparation was                            evaluated using the BBPS [facility] Bowel Preparation                            Scale) with scores of: Right Colon = 3, Transverse                            Colon = 3 and Left Colon = 3 (entire mucosa seen                            well with no residual  staining, small fragments of                            stool or opaque liquid). The total BBPS score                            equals 9. Scope In: 11:10:48 AM Scope Out: 11:20:55 AM Scope Withdrawal Time: 0 hours 8 minutes 55 seconds  Total Procedure Duration: 0 hours 10 minutes 7 seconds  Findings:      Non-bleeding internal hemorrhoids were found during endoscopy.      Two sessile polyps were found in the cecum. The polyps were 4 to 5 mm in       size. These polyps were removed with a cold snare. Resection and       retrieval were complete.      A 6 mm polyp was found in the descending colon. The polyp was sessile.       The polyp was removed with a cold snare. Resection and retrieval were       complete.      The terminal ileum appeared normal.      The exam was otherwise without abnormality. Impression:               - Non-bleeding internal hemorrhoids.                           - Two 4 to 5 mm polyps in the cecum, removed with a                            cold snare. Resected and retrieved.                           - One 6 mm polyp in the descending colon, removed                            with a cold snare. Resected and retrieved.                           - The examined portion of the ileum was normal.                           -  The examination was otherwise normal. Moderate Sedation:      Per Anesthesia Care Recommendation:           - Patient has a contact number available for                            emergencies. The signs and symptoms of potential                            delayed complications were discussed with the                            patient. Return to normal activities tomorrow.                            Written discharge instructions were provided to the                            patient.                           - Resume previous diet.                           - Continue present medications.                           - Await pathology results.                            - Repeat colonoscopy in 5 years for surveillance.                           - Return to GI clinic in 4 weeks. Procedure Code(s):        --- Professional ---                           [phone], Colonoscopy, flexible; with removal of                            tumor(s), polyp(s), or other lesion(s) by snare                            technique Diagnosis Code(s):        --- Professional ---                           D12.0, Benign neoplasm of cecum                           D12.4, Benign neoplasm of descending colon                           K64.8, Other hemorrhoids                           D50.9, Iron deficiency anemia, unspecified CPT copyright 2022 American  Medical Association. All rights reserved. The codes documented in this report are preliminary and upon coder review may  be revised to meet current compliance requirements. Rolando Cliche. Mordechai April, DO Rolando Cliche. Mordechai April, DO 09/03/2023 12:22:53 PM This report has been signed electronically. Number of Addenda: 0

## 2023-09-03 NOTE — Anesthesia Preprocedure Evaluation (Signed)
 Anesthesia Evaluation  Patient identified by MRN, date of birth, ID band Patient awake    Reviewed: Allergy & Precautions, NPO status , Patient's Chart, lab work & pertinent test results, reviewed documented beta blocker date and time   History of Anesthesia Complications (+) PONV and history of anesthetic complications  Airway Mallampati: I  TM Distance: >3 FB Neck ROM: Full   Comment: Cleft palate repair  Dental  (+) Missing, Chipped, Dental Advisory Given, Poor Dentition   Pulmonary shortness of breath and with exertion   Pulmonary exam normal breath sounds clear to auscultation       Cardiovascular Exercise Tolerance: Good METS: 3 - Mets hypertension, Pt. on medications and Pt. on home beta blockers + CAD and + CABG (2010)  Normal cardiovascular exam Rhythm:Regular Rate:Normal - Systolic murmurs, - Diastolic murmurs, - Friction Rub, - Carotid Bruit, - Peripheral Edema and - Systolic Click 07-Jan-2020 08:56:08 [facility] ROUTINE RECORD Normal sinus rhythm Normal ECG Confirmed by Peder Bourdon ([id number]) on 01/07/2020 4:34:03 PM   Neuro/Psych negative neurological ROS  negative psych ROS   GI/Hepatic negative GI ROS, Neg liver ROS,,,  Endo/Other  negative endocrine ROS    Renal/GU negative Renal ROS     Musculoskeletal negative musculoskeletal ROS (+)    Abdominal   Peds  Hematology negative hematology ROS (+)   Anesthesia Other Findings Cleft palate repair  Reproductive/Obstetrics negative OB ROS                             Anesthesia Physical Anesthesia Plan  ASA: 3  Anesthesia Plan: General   Post-op Pain Management: Minimal or no pain anticipated   Induction: Intravenous  PONV Risk Score and Plan: Propofol  infusion  Airway Management Planned: Nasal Cannula and Natural Airway  Additional Equipment: None  Intra-op Plan:   Post-operative Plan:    Informed Consent: I have reviewed the patients History and Physical, chart, labs and discussed the procedure including the risks, benefits and alternatives for the proposed anesthesia with the patient or authorized representative who has indicated his/her understanding and acceptance.     Dental advisory given  Plan Discussed with: CRNA  Anesthesia Plan Comments:         Anesthesia Quick Evaluation

## 2023-09-03 NOTE — Anesthesia Postprocedure Evaluation (Signed)
 Anesthesia Post Note  Patient: Kyle Boyd  Procedure(s) Performed: COLONOSCOPY EGD (ESOPHAGOGASTRODUODENOSCOPY)  Patient location during evaluation: PACU Anesthesia Type: General Level of consciousness: awake and alert Pain management: pain level controlled Vital Signs Assessment: post-procedure vital signs reviewed and stable Respiratory status: spontaneous breathing, nonlabored ventilation, respiratory function stable and patient connected to nasal cannula oxygen Cardiovascular status: blood pressure returned to baseline and stable Postop Assessment: no apparent nausea or vomiting Anesthetic complications: no   There were no known notable events for this encounter.   Last Vitals:  Vitals:   09/03/23 1040 09/03/23 1125  BP: (!) 144/79 101/73  Pulse:  93  Resp: 18 (!) 22  Temp: 36.8 C (!) 36.4 C  SpO2: 98% 98%    Last Pain:  Vitals:   09/03/23 1125  TempSrc: Oral  PainSc:                  Sandy Crumb

## 2023-09-03 NOTE — Transfer of Care (Signed)
 Immediate Anesthesia Transfer of Care Note  Patient: Kyle Boyd  Procedure(s) Performed: COLONOSCOPY EGD (ESOPHAGOGASTRODUODENOSCOPY)  Patient Location: Endoscopy Unit  Anesthesia Type:General  Level of Consciousness: awake, alert , oriented, and patient cooperative  Airway & Oxygen Therapy: Patient Spontanous Breathing  Post-op Assessment: Report given to RN, Post -op Vital signs reviewed and stable, and Patient moving all extremities X 4  Post vital signs: Reviewed and stable  Last Vitals:  Vitals Value Taken Time  BP 101/73 09/03/23 1125  Temp 36.4 C 09/03/23 1125  Pulse 93 09/03/23 1125  Resp 22 09/03/23 1125  SpO2 98 % 09/03/23 1125    Last Pain:  Vitals:   09/03/23 1125  TempSrc: Oral  PainSc:          Complications: No notable events documented.

## 2023-09-04 ENCOUNTER — Encounter (HOSPITAL_COMMUNITY): Payer: Self-pay | Admitting: Internal Medicine

## 2023-09-05 ENCOUNTER — Ambulatory Visit (HOSPITAL_COMMUNITY)
Admission: RE | Admit: 2023-09-05 | Discharge: 2023-09-05 | Disposition: A | Discharge status: Home / Self Care | Source: Ambulatory Visit | Attending: Gastroenterology | Admitting: Gastroenterology

## 2023-09-05 DIAGNOSIS — R161 Splenomegaly, not elsewhere classified: Secondary | ICD-10-CM | POA: Diagnosis not present

## 2023-09-05 DIAGNOSIS — D696 Thrombocytopenia, unspecified: Secondary | ICD-10-CM | POA: Diagnosis not present

## 2023-09-05 DIAGNOSIS — K7689 Other specified diseases of liver: Secondary | ICD-10-CM | POA: Diagnosis not present

## 2023-09-06 ENCOUNTER — Ambulatory Visit: Attending: Nurse Practitioner | Admitting: Nurse Practitioner

## 2023-09-06 ENCOUNTER — Encounter: Payer: Self-pay | Admitting: Nurse Practitioner

## 2023-09-06 VITALS — BP 128/70 | HR 65 | Ht 65.0 in | Wt 166.0 lb

## 2023-09-06 DIAGNOSIS — E782 Mixed hyperlipidemia: Secondary | ICD-10-CM | POA: Diagnosis not present

## 2023-09-06 DIAGNOSIS — R5383 Other fatigue: Secondary | ICD-10-CM

## 2023-09-06 DIAGNOSIS — D649 Anemia, unspecified: Secondary | ICD-10-CM

## 2023-09-06 DIAGNOSIS — I10 Essential (primary) hypertension: Secondary | ICD-10-CM | POA: Diagnosis not present

## 2023-09-06 DIAGNOSIS — I25119 Atherosclerotic heart disease of native coronary artery with unspecified angina pectoris: Secondary | ICD-10-CM | POA: Diagnosis not present

## 2023-09-06 DIAGNOSIS — D696 Thrombocytopenia, unspecified: Secondary | ICD-10-CM

## 2023-09-06 DIAGNOSIS — R29818 Other symptoms and signs involving the nervous system: Secondary | ICD-10-CM

## 2023-09-06 DIAGNOSIS — K7460 Unspecified cirrhosis of liver: Secondary | ICD-10-CM

## 2023-09-06 DIAGNOSIS — R079 Chest pain, unspecified: Secondary | ICD-10-CM | POA: Diagnosis not present

## 2023-09-06 NOTE — Progress Notes (Unsigned)
 Cardiology Office Note:  .   Date:  09/06/2023 ID:  Kyle Boyd, DOB 03-03-1951, MRN [id number] PCP: Orest Bio, MD   [facility] Providers Cardiologist:  Teddie Favre, MD    History of Present Illness: .   Kyle Boyd is a 73 y.o. male with a PMH of CAD, s/p CABG in 2010, mixed hyperlipidemia, hypertension, aortic valve sclerosis, BPH, who presents today for scheduled follow-up.  Last seen by Dr. Londa Rival on February 08, 2022.  Was doing well at that time.   07/23/2023 - Today presents for follow-up.  Continues to note fatigue.  Also admits to chest discomfort when he is outside in the cold air/windy outside environment. Denies any recent chest pain, shortness of breath, palpitations, syncope, presyncope, dizziness, orthopnea, PND, swelling or significant weight changes, acute bleeding, or claudication.  09/06/2023 - Today he presents for follow-up. Underwent colonoscopy since I last saw him. Non-bleeding internal hemorrhoids noted.  Two 4 to 5 mm polyps in cecum, removed with cold snare, resected and retrieved.  One 6 mm polyp in the descending colon, removed with a cold snare.  Resected and retrieved.Also  underwent US  of his abdomen that revealed no acute abnormality, nodular contour with increased echotexture of liver, suggestive of cirrhosis, splenomegaly noted. Doing the same since I last saw him.  Continues to note fatigue and chest discomfort only noted with cold air/windy outside environment. Denies any recent chest pain.  He has questions and concerns regarding his most recent abdominal ultrasound.  Blood pressure log reviewed and reveals very well-controlled blood pressure readings.  ROS: Negative. See HPI.   Studies Reviewed: Aaron Aas    EKG: EKG is not ordered today.  Echo 04/2023: 1. Left ventricular ejection fraction, by estimation, is 60 to 65%. The  left ventricle has normal function. The left ventricle has no regional  wall motion abnormalities. There is mild  concentric left ventricular  hypertrophy. Left ventricular diastolic  parameters are consistent with Grade II diastolic dysfunction  (pseudonormalization).   2. Right ventricular systolic function is normal. The right ventricular  size is normal. There is mildly elevated pulmonary artery systolic  pressure. The estimated right ventricular systolic pressure is 38.3 mmHg.   3. The mitral valve is grossly normal. Trivial mitral valve  regurgitation.   4. The aortic valve is tricuspid. There is mild calcification of the  aortic valve. Aortic valve regurgitation is not visualized. Aortic valve  sclerosis/calcification is present, without any evidence of aortic  stenosis. Aortic valve mean gradient  measures 13.0 mmHg. Dimentionless index 0.57.   5. The inferior vena cava is normal in size with greater than 50%  respiratory variability, suggesting right atrial pressure of 3 mmHg.   Comparison(s): Prior images reviewed side by side. LVEF normal at 69-65%  range. Moderately sclerotic aortic valve.  Echo 08/2020:  1. Left ventricular ejection fraction, by estimation, is 60 to 65%. The  left ventricle has normal function. The left ventricle has no regional  wall motion abnormalities. Left ventricular diastolic parameters are  indeterminate.   2. Right ventricular systolic function is normal. The right ventricular  size is normal.   3. Left atrial size was mildly dilated.   4. The mitral valve is normal in structure. No evidence of mitral valve  regurgitation. No evidence of mitral stenosis.   5. The aortic valve has an indeterminant number of cusps. There is mild  calcification of the aortic valve. There is mild thickening of the aortic  valve. Aortic  valve regurgitation is not visualized. No aortic stenosis is  present.   6. The inferior vena cava is normal in size with greater than 50%  respiratory variability, suggesting right atrial pressure of 3 mmHg.  Physical Exam:   VS:  BP 128/70    Pulse 65   Ht 5\' 5"  (1.651 m)   Wt 166 lb (75.3 kg)   SpO2 97%   BMI 27.62 kg/m    Wt Readings from Last 3 Encounters:  09/06/23 166 lb (75.3 kg)  09/03/23 175 lb 0.7 oz (79.4 kg)  08/29/23 175 lb (79.4 kg)    GEN: Well nourished, well developed in no acute distress NECK: No JVD; No carotid bruits CARDIAC: S1/S2, RRR, Grade 2/6 murmur, no rubs, no gallops RESPIRATORY:  Clear to auscultation without rales, wheezing or rhonchi  ABDOMEN: Soft, non-tender, non-distended EXTREMITIES:  No edema; No deformity   ASSESSMENT AND PLAN: .    CAD, s/p CABG in 2010, chest pain of uncertain etiology Etiology unclear and multifactorial.  Did discuss medication management with patient and came to shared medical decision that we will continue to monitor at this time.  No indication for ischemic evaluation. Continue ASA, Lopressor , Simvastatin, and NTG PRN.  Heart healthy diet and regular cardiovascular exercise encouraged.  Care and ED precautions discussed.  I did discuss starting low dose Amlodipine to improve symptoms based on his description of symptoms, will request CBC, CMET, and INR are obtained with PCP and if labs allow, will begin low dose Norvasc, consulted GI who stated this should be okay.  Imdur would not be a good option as he admits to headache at times.  Mixed hyperlipidemia No recent labs on file. Continue simvastatin. Being managed by PCP. Heart healthy diet and regular cardiovascular exercise encouraged.  Will request labs from PCP.  HTN BP stable. Discussed to monitor BP at home at least 2 hours after medications and sitting for 5-10 minutes. Continue current medication regimen. Heart healthy diet and regular cardiovascular exercise encouraged. Requesting labs as mentioned above.  Fatigue, Anemia/thrombocytopenia/ cirrhosis Most recent labs revealed improved Hgb, would explain his fatigue.  Following GI. Will request a CBC, CMET, and INR are obtained prior to his upcoming GI appt.    Continue follow-up with PCP.  Care and ED precautions discussed.  5. Suspected sleep apnea Mildly elevated PASP noted on echocardiogram in December 2024.  STOP-BANG score 6.  Previously referred to pulmonology for OSA evaluation.   Dispo: Will arrange that these labs are checked when he goes to his PCP's appt soon. Follow-up with me/[facility] in 4 weeks or sooner if anything changes.   Signed, Lasalle Pointer, NP

## 2023-09-06 NOTE — Patient Instructions (Addendum)
 Medication Instructions:  Your physician recommends that you continue on your current medications as directed. Please refer to the Current Medication list given to you today.  Labwork: When you see Dr.Sasser   Testing/Procedures: None   Follow-Up: Your physician recommends that you schedule a follow-up appointment in: 1 Month   Any Other Special Instructions Will Be Listed Below (If Applicable).  If you need a refill on your cardiac medications before your next appointment, please call your pharmacy.

## 2023-09-10 LAB — SURGICAL PATHOLOGY

## 2023-09-13 DIAGNOSIS — D649 Anemia, unspecified: Secondary | ICD-10-CM | POA: Diagnosis not present

## 2023-09-13 DIAGNOSIS — K7460 Unspecified cirrhosis of liver: Secondary | ICD-10-CM | POA: Diagnosis not present

## 2023-09-13 DIAGNOSIS — I10 Essential (primary) hypertension: Secondary | ICD-10-CM | POA: Diagnosis not present

## 2023-09-17 ENCOUNTER — Encounter: Payer: Self-pay | Admitting: Nurse Practitioner

## 2023-09-17 DIAGNOSIS — E782 Mixed hyperlipidemia: Secondary | ICD-10-CM | POA: Diagnosis not present

## 2023-09-17 DIAGNOSIS — I2510 Atherosclerotic heart disease of native coronary artery without angina pectoris: Secondary | ICD-10-CM | POA: Diagnosis not present

## 2023-09-17 DIAGNOSIS — I700 Atherosclerosis of aorta: Secondary | ICD-10-CM | POA: Diagnosis not present

## 2023-09-17 DIAGNOSIS — Z6826 Body mass index (BMI) 26.0-26.9, adult: Secondary | ICD-10-CM | POA: Diagnosis not present

## 2023-09-17 DIAGNOSIS — D509 Iron deficiency anemia, unspecified: Secondary | ICD-10-CM | POA: Diagnosis not present

## 2023-10-01 NOTE — Progress Notes (Unsigned)
 Referring Provider: Orest Bio, MD Primary Care Physician:  Orest Bio, MD Primary GI Physician: Dr. Mordechai April  No chief complaint on file.   HPI:   Kyle Boyd is a 73 y.o. male presenting today with a history of    Last seen in the office at the time of initial consult for IDA on 08/10/2023.  He reported occasional toilet tissue hematochezia if having a large stool.  Dark stool since starting iron in March, but not prior.  Rare heartburn.  Rare dysphagia with meats and breads.  History of cleft palate with dysphagia symptoms off and on for years.  No NSAIDs aside from 81 mg aspirin  which she stopped 1 month ago.  Noted hemoglobin had been down to 6.4 in March 2025.  Patient reported never receiving a blood transfusion, but starting iron.  Plan to proceed with EGD and colonoscopy and also request recent labs from PCP.  Received labs completed 08/07/23 showing hemoglobin improved to 8.5, platelets low at 62.  Noted chronic thrombocytopenia and recommended ultrasound.  Colonoscopy 09/03/2023: - Nonbleeding internal hemorrhoids - Two 4-5 mm polyps in the cecum resected and retrieved - One 6 mm polyp in the descending colon resected and retrieved. - Recommended 5-year surveillance. - Pathology with 1 tubular adenoma, polypoid colonic mucosa without cytologic dysplasia removed from the cecum.  EGD 09/03/2023: - Grade 2 esophageal varices - Portal hypertensive gastropathy - Gastritis biopsied - Recommended strict NSAID avoidance, PPI twice daily, and carvedilol/NSBB if cirrhosis confirmed with imaging. - Pathology with chronic inactive gastritis, no H. pylori.   Abdominal ultrasound 09/05/2023 with cirrhosis and splenomegaly.   Labs scanned in dated 09/13/2023: Hemoglobin 12.5 (L), platelets 77 (L), sodium 139, potassium 4.2, creatinine 0.84, albumin 3.9, AST 33 (H), ALT 21, alk phos 60, total bilirubin 0.9, INR 1.2.  Today:    MELD 3.0: 9 on 09/13/23 AFP:  Hep A/B  vaccination:  EGD:   BB:  Ascites/peripheral edema:  Diuretics:  Paracentesis:  History of SBP:  Encephalopathy:     EtOH: Illicit drug use: Tattoos: Hepatitis exposure:  Supplements/herbals: Family history of liver disease: Family or personal history of autoimmune conditions:     Past Medical History:  Diagnosis Date   CAD (coronary artery disease)    Multivessel status post CABG 2010   Carotid artery disease (HCC)    Cleft palate    s/p repair   Erectile dysfunction    Essential hypertension    PONV (postoperative nausea and vomiting)     Past Surgical History:  Procedure Laterality Date   CATARACT EXTRACTION W/PHACO Left 01/09/2020   Procedure: CATARACT EXTRACTION PHACO AND INTRAOCULAR LENS PLACEMENT (IOC);  Surgeon: Tarri Farm, MD;  Location: [facility];  Service: Ophthalmology;  Laterality: Left;  CDE: 6.99   CATARACT EXTRACTION W/PHACO Right 01/23/2020   Procedure: CATARACT EXTRACTION PHACO AND INTRAOCULAR LENS PLACEMENT RIGHT EYE;  Surgeon: Tarri Farm, MD;  Location: [facility];  Service: Ophthalmology;  Laterality: Right;  CDE: 6.11   CLEFT PALATE REPAIR     COLONOSCOPY N/A 09/03/2023   Procedure: COLONOSCOPY;  Surgeon: [name], DO;  Location: [facility];  Service: Endoscopy;  Laterality: N/A;  11:15 am, asa 3   CORONARY ARTERY BYPASS GRAFT  02/2009   Dr. Matt Song - LIMA to LAD, SVG to diagonal, SVG to OM, SVG to RCA   ESOPHAGOGASTRODUODENOSCOPY N/A 09/03/2023   Procedure: EGD (ESOPHAGOGASTRODUODENOSCOPY);  Surgeon: [name], DO;  Location: [facility];  Service: Endoscopy;  Laterality: N/A;  11:15 am, asa 3   plastic ear drum      right    Current Outpatient Medications  Medication Sig Dispense Refill   acetaminophen (TYLENOL) 500 MG tablet Take 500 mg by mouth every 6 (six) hours as needed for moderate pain.     aspirin  EC 81 MG tablet Take 1 tablet (81 mg total) by mouth daily. (Patient not taking: Reported on 08/10/2023) 90 tablet  3   ferrous sulfate 324 MG TBEC Take 324 mg by mouth. Twice daily     metoprolol  tartrate (LOPRESSOR ) 25 MG tablet Take 1 tablet (25 mg total) by mouth 2 (two) times daily. 60 tablet 6   nitroGLYCERIN  (NITROSTAT ) 0.4 MG SL tablet Place 1 tablet (0.4 mg total) under the tongue every 5 (five) minutes x 3 doses as needed. If no relief after 3rd dose, proceed to the ED for an evaluation 25 tablet 3   simvastatin (ZOCOR) 40 MG tablet Take 40 mg by mouth at bedtime.     No current facility-administered medications for this visit.    Allergies as of 10/03/2023   (No Known Allergies)    Family History  Problem Relation Age of Onset   Heart disease Mother    Colon cancer Neg Hx    Stomach cancer Neg Hx     Social History   Socioeconomic History   Marital status: Married    Spouse name: Not on file   Number of children: 2   Years of education: Not on file   Highest education level: Not on file  Occupational History   Occupation: full time    Comment: Cabin crew facility in [place]   Tobacco Use   Smoking status: Never   Smokeless tobacco: Never  Vaping Use   Vaping status: Never Used  Substance and Sexual Activity   Alcohol  use: No    Alcohol /week: 0.0 standard drinks of alcohol    Drug use: No   Sexual activity: Not on file  Other Topics Concern   Not on file  Social History Narrative   Not on file   Social Drivers of Health   Financial Resource Strain: Not on file  Food Insecurity: Not on file  Transportation Needs: Not on file  Physical Activity: Not on file  Stress: Not on file  Social Connections: Not on file    Review of Systems: Gen: Denies fever, chills, anorexia. Denies fatigue, weakness, weight loss.  CV: Denies chest pain, palpitations, syncope, peripheral edema, and claudication. Resp: Denies dyspnea at rest, cough, wheezing, coughing up blood, and pleurisy. GI: Denies vomiting blood, jaundice, and fecal incontinence.   Denies dysphagia or  odynophagia. Derm: Denies rash, itching, dry skin Psych: Denies depression, anxiety, memory loss, confusion. No homicidal or suicidal ideation.  Heme: Denies bruising, bleeding, and enlarged lymph nodes.  Physical Exam: There were no vitals taken for this visit. General:   Alert and oriented. No distress noted. Pleasant and cooperative.  Head:  Normocephalic and atraumatic. Eyes:  Conjuctiva clear without scleral icterus. Heart:  S1, S2 present without murmurs appreciated. Lungs:  Clear to auscultation bilaterally. No wheezes, rales, or rhonchi. No distress.  Abdomen:  +BS, soft, non-tender and non-distended. No rebound or guarding. No HSM or masses noted. Msk:  Symmetrical without gross deformities. Normal posture. Extremities:  Without edema. Neurologic:  Alert and  oriented x4 Psych:  Normal mood and affect.    Assessment:     Plan:  ***   Shana Daring,  PA-C [facility] 10/03/2023

## 2023-10-03 ENCOUNTER — Ambulatory Visit: Admitting: Gastroenterology

## 2023-10-03 ENCOUNTER — Encounter: Payer: Self-pay | Admitting: Gastroenterology

## 2023-10-03 VITALS — BP 130/82 | HR 59 | Temp 97.7°F | Ht 65.0 in | Wt 163.8 lb

## 2023-10-03 DIAGNOSIS — K7469 Other cirrhosis of liver: Secondary | ICD-10-CM

## 2023-10-03 DIAGNOSIS — K7460 Unspecified cirrhosis of liver: Secondary | ICD-10-CM | POA: Diagnosis not present

## 2023-10-03 DIAGNOSIS — D696 Thrombocytopenia, unspecified: Secondary | ICD-10-CM | POA: Diagnosis not present

## 2023-10-03 DIAGNOSIS — K3189 Other diseases of stomach and duodenum: Secondary | ICD-10-CM | POA: Diagnosis not present

## 2023-10-03 DIAGNOSIS — D509 Iron deficiency anemia, unspecified: Secondary | ICD-10-CM

## 2023-10-03 MED ORDER — PANTOPRAZOLE SODIUM 40 MG PO TBEC
40.0000 mg | DELAYED_RELEASE_TABLET | Freq: Two times a day (BID) | ORAL | 3 refills | Status: DC
Start: 2023-10-03 — End: 2024-02-07

## 2023-10-03 NOTE — Patient Instructions (Signed)
 Please have blood work completed at [facility].  Start pantoprazole 40 mg twice daily.   I am reaching out to Lasalle Pointer, NP to see about possibly switching metoprolol  to carvedilol.  Continue iron daily.  Nutrition:  High-protein diet from a primarily plant-based diet. Avoid red meat.  No raw or undercooked meat, seafood, or shellfish. Low-fat/cholesterol/carbohydrate diet. Limit sodium to no more than 2000 mg/day including everything that you eat and drink. Recommend at least 30 minutes of aerobic and resistance exercise 3 days/week.  Mediterranean diet is a good, healthy diet to follow, but you will still need to check labels when purchasing foods to see how much sodium is in there.   I will see you back in 3 months or sooner if needed.   Shana Daring, PA-C [facility]

## 2023-10-04 ENCOUNTER — Telehealth: Payer: Self-pay | Admitting: Nurse Practitioner

## 2023-10-04 MED ORDER — CARVEDILOL 3.125 MG PO TABS: 3.1250 mg | ORAL_TABLET | Freq: Two times a day (BID) | ORAL | 2 refills | Status: DC

## 2023-10-04 NOTE — Telephone Encounter (Signed)
 Patient informed and verbalized understanding of plan. Patient Lopressor  D/C and new medication coreg 3.125 BID sent to [facility] in [place]  Nurse visit scheduled for 6/5 at [facility]

## 2023-10-04 NOTE — Telephone Encounter (Signed)
 Kyle Pointer, Kyle Boyd  Kyle Hills, Kyle Boyd Cc: Kyle Boyd Thanks for reaching out. That should be fine.  I will loop in my CMA. Let's stop his Lopressor  and begin Coreg 3.125 mg BID and bring him in 1 week to obtain EKG and vitals, Tori.  Thanks so much!  Best, Kyle Pointer, Kyle Boyd       Previous Messages    ----- Message ----- From: Samual Crochet Sent: 10/03/2023   4:22 PM EDT To: Kyle Hills, Kyle Boyd; Kyle Pointer, Kyle Boyd Subject: Beta Blocker                                  Hi Nellie Banas,  Quick question for you. This patient has esophageal varices. Ideally we would like to have him on a NSBB. He is currently on metoprolol . Would it be ok to switch him to NSBB like carvedilol or what are your thoughts from cardiac standpoint?  Thanks,  [employer]

## 2023-10-04 NOTE — Telephone Encounter (Signed)
 Called patient and left voicemail for patient to call back.

## 2023-10-08 ENCOUNTER — Other Ambulatory Visit (HOSPITAL_COMMUNITY)
Admission: RE | Admit: 2023-10-08 | Discharge: 2023-10-08 | Disposition: A | Discharge status: Home / Self Care | Source: Ambulatory Visit | Attending: Gastroenterology | Admitting: Gastroenterology

## 2023-10-08 DIAGNOSIS — K7469 Other cirrhosis of liver: Secondary | ICD-10-CM | POA: Diagnosis not present

## 2023-10-08 LAB — HEPATITIS B SURFACE ANTIBODY,QUALITATIVE: Hep B S Ab: NONREACTIVE

## 2023-10-08 LAB — HEPATITIS A ANTIBODY, TOTAL: hep A Total Ab: NONREACTIVE

## 2023-10-08 LAB — HEPATITIS C ANTIBODY: HCV Ab: NONREACTIVE

## 2023-10-08 LAB — HEPATITIS B SURFACE ANTIGEN: Hepatitis B Surface Ag: NONREACTIVE

## 2023-10-09 ENCOUNTER — Ambulatory Visit: Payer: Self-pay | Admitting: Gastroenterology

## 2023-10-09 LAB — IGG, IGA, IGM
IgA: 461 mg/dL — ABNORMAL HIGH (ref 61–437)
IgG (Immunoglobin G), Serum: 1191 mg/dL (ref 603–1613)
IgM (Immunoglobulin M), Srm: 89 mg/dL (ref 15–143)

## 2023-10-09 LAB — HEPATITIS B CORE ANTIBODY, TOTAL: HEP B CORE AB: NEGATIVE

## 2023-10-09 LAB — MITOCHONDRIAL ANTIBODIES: Mitochondrial M2 Ab, IgG: <20 U (ref 0.0–20.0)

## 2023-10-09 LAB — ANTI-SMOOTH MUSCLE ANTIBODY, IGG: F-Actin IgG: 27 U — ABNORMAL HIGH (ref 0–19)

## 2023-10-09 LAB — CERULOPLASMIN: Ceruloplasmin: 23.2 mg/dL (ref 16.0–31.0)

## 2023-10-09 LAB — ALPHA-1-ANTITRYPSIN: A-1 Antitrypsin, Ser: 149 mg/dL (ref 101–187)

## 2023-10-09 LAB — ANA: Anti Nuclear Antibody (ANA): NEGATIVE

## 2023-10-10 NOTE — Progress Notes (Unsigned)
 Name: Kyle Boyd DOB: 08/22/1950 MRN: [id number]  History of Present Illness: Kyle Boyd is a 73 y.o. male who presents today for follow up visit at [facility] [place].  Relevant History includes: 1. BPH with LUTS (weak intermittent urinary stream, straining to void, urgency, frequency, nocturia x2, urge incontinence a few times per week mostly at night).  - Denies family history of prostate cancer. 2. Erectile dysfunction. - He denies prior use of PDE-5 inhibitors (Viagra, Cialis, Levitra). He has a prescription for nitrates but denies ever needing to take that.  At initial visit on 04/13/2023: - The plan was: For BPH: Pt elected expectant management and PSA check.  For ED: Testosterone  to screen for hypogonadism. Would need cardiology clearance prior to any use of PDE-5 inhibitors in the future.  Return in about 6 months (around 10/12/2023) for UA, PVR, & f/u with Griselda Lederer NP.  Since last visit: > 04/23/2023:  - PSA normal (3.1) - Testosterone  normal (840) at 0930  > Per cardiology note on 09/06/2023: "Suspected sleep apnea. Mildly elevated PASP noted on echocardiogram in December 2024.  STOP-BANG score 6.  Previously referred to pulmonology for OSA evaluation."  Today: He reports that his urinary symptoms are manageable. He has nocturia x3 and occasional urge incontinence if he delays voiding. Reports somewhat weak urinary stream and occasional straining to void. Denies bothersome daytime urinary urgency or frequency. Denies dysuria or gross hematuria. Continues to be bothered by his erectile dysfunction but denies interest in trying PDE-5 inhibitor medication due to concern for potential side effects.     Medications: Current Outpatient Medications  Medication Sig Dispense Refill   ferrous sulfate 324 MG TBEC Take 324 mg by mouth. Twice daily     acetaminophen (TYLENOL) 500 MG tablet Take 500 mg by mouth every 6 (six) hours as needed for moderate pain.      aspirin  EC 81 MG tablet Take 1 tablet (81 mg total) by mouth daily. (Patient not taking: Reported on 08/10/2023) 90 tablet 3   carvedilol  (COREG ) 3.125 MG tablet Take 1 tablet (3.125 mg total) by mouth 2 (two) times daily with a meal. 60 tablet 2   nitroGLYCERIN  (NITROSTAT ) 0.4 MG SL tablet Place 1 tablet (0.4 mg total) under the tongue every 5 (five) minutes x 3 doses as needed. If no relief after 3rd dose, proceed to the ED for an evaluation 25 tablet 3   pantoprazole  (PROTONIX ) 40 MG tablet Take 1 tablet (40 mg total) by mouth 2 (two) times daily. 60 tablet 3   simvastatin (ZOCOR) 40 MG tablet Take 40 mg by mouth at bedtime.     No current facility-administered medications for this visit.    Allergies: No Known Allergies  Past Medical History:  Diagnosis Date   CAD (coronary artery disease)    Multivessel status post CABG 2010   Carotid artery disease (HCC)    Cirrhosis (HCC)    Cleft palate    s/p repair   Erectile dysfunction    Essential hypertension    PONV (postoperative nausea and vomiting)    Past Surgical History:  Procedure Laterality Date   CATARACT EXTRACTION W/PHACO Left 01/09/2020   Procedure: CATARACT EXTRACTION PHACO AND INTRAOCULAR LENS PLACEMENT (IOC);  Surgeon: Tarri Farm, MD;  Location: [facility];  Service: Ophthalmology;  Laterality: Left;  CDE: 6.99   CATARACT EXTRACTION W/PHACO Right 01/23/2020   Procedure: CATARACT EXTRACTION PHACO AND INTRAOCULAR LENS PLACEMENT RIGHT EYE;  Surgeon: Tarri Farm, MD;  Location:  [facility];  Service: Ophthalmology;  Laterality: Right;  CDE: 6.11   CLEFT PALATE REPAIR     COLONOSCOPY N/A 09/03/2023   Surgeon: Goble Last K, DO; nonbleeding internal hemorrhoids, two 4-5 mm polyps in the cecum, one 6 mm polyp in the descending colon.  Pathology with 1 tubular adenoma, polypoid colonic mucosa with bowel cytologic dysplasia.  Recommended 5-year surveillance.   CORONARY ARTERY BYPASS GRAFT  02/2009   Dr. Matt Song - LIMA to LAD,  SVG to diagonal, SVG to OM, SVG to RCA   ESOPHAGOGASTRODUODENOSCOPY N/A 09/03/2023   Surgeon: [name], DO; grade 2 esophageal varices, portal hypertensive gastropathy, gastritis biopsy.  Pathology with chronic inactive gastritis, no H. pylori.   plastic ear drum      right   Family History  Problem Relation Age of Onset   Heart disease Mother    Colon cancer Neg Hx    Stomach cancer Neg Hx    Social History   Socioeconomic History   Marital status: Married    Spouse name: Not on file   Number of children: 2   Years of education: Not on file   Highest education level: Not on file  Occupational History   Occupation: full time    Comment: Cabin crew facility in [place]   Tobacco Use   Smoking status: Never   Smokeless tobacco: Never  Vaping Use   Vaping status: Never Used  Substance and Sexual Activity   Alcohol  use: No    Alcohol /week: 0.0 standard drinks of alcohol    Drug use: No   Sexual activity: Not on file  Other Topics Concern   Not on file  Social History Narrative   Not on file   Social Drivers of Health   Financial Resource Strain: Not on file  Food Insecurity: Not on file  Transportation Needs: Not on file  Physical Activity: Not on file  Stress: Not on file  Social Connections: Not on file  Intimate Partner Violence: Not on file    Review of Systems Constitutional: Patient denies any unintentional weight loss or change in strength lntegumentary: Patient denies any rashes or pruritus Cardiovascular: Patient denies chest pain or syncope Respiratory: Patient denies shortness of breath Gastrointestinal: Patient denies nausea, vomiting, constipation, or diarrhea  Musculoskeletal: Patient denies muscle cramps or weakness Neurologic: Patient denies convulsions or seizures Allergic/Immunologic: Patient denies recent allergic reaction(s) Hematologic/Lymphatic: Patient denies bleeding tendencies Endocrine: Patient denies heat/cold  intolerance  GU: As per HPI.  OBJECTIVE Vitals:   10/11/23 0918  BP: 132/76  Pulse: 64   There is no height or weight on file to calculate BMI.  Physical Examination Constitutional: No obvious distress; patient is non-toxic appearing  Cardiovascular: No visible lower extremity edema.  Respiratory: The patient does not have audible wheezing/stridor; respirations do not appear labored  Gastrointestinal: Abdomen non-distended Musculoskeletal: Normal ROM of UEs  Skin: No obvious rashes/open sores  Neurologic: CN 2-12 grossly intact Psychiatric: Answered questions appropriately with normal affect  Hematologic/Lymphatic/Immunologic: No obvious bruises or sites of spontaneous bleeding  UA: negative  PVR: 26 ml  ASSESSMENT Benign prostatic hyperplasia with urinary frequency - Plan: Urinalysis, Routine w reflex microscopic, BLADDER SCAN AMB NON-IMAGING  Erectile dysfunction, unspecified erectile dysfunction type - Plan: Urinalysis, Routine w reflex microscopic, BLADDER SCAN AMB NON-IMAGING  He is doing well with no acute concerns. Declines Flomax or ED medication (would need cardiology clearance for that). We agreed to plan for follow up in 1 year or sooner if needed. Patient  verbalized understanding of and agreement with current plan. All questions were answered.  PLAN Advised the following: 1. Return in about 1 year (around 10/10/2024) for UA, PVR, & f/u with Griselda Lederer NP.  Orders Placed This Encounter  Procedures   Urinalysis, Routine w reflex microscopic   BLADDER SCAN AMB NON-IMAGING    It has been explained that the patient is to follow regularly with their PCP in addition to all other providers involved in their care and to follow instructions provided by these respective offices. Patient advised to contact urology clinic if any urologic-pertaining questions, concerns, new symptoms or problems arise in the interim period.  There are no Patient Instructions on file for  this visit.  Electronically signed by:  Lauretta Ponto, FNP   10/11/23    9:41 AM

## 2023-10-11 ENCOUNTER — Ambulatory Visit: Attending: Cardiology | Admitting: *Deleted

## 2023-10-11 ENCOUNTER — Encounter: Payer: Self-pay | Admitting: Urology

## 2023-10-11 ENCOUNTER — Encounter: Payer: Self-pay | Admitting: *Deleted

## 2023-10-11 ENCOUNTER — Ambulatory Visit: Admitting: Urology

## 2023-10-11 VITALS — BP 132/76 | HR 64

## 2023-10-11 VITALS — BP 124/74 | HR 64 | Ht 65.0 in | Wt 160.4 lb

## 2023-10-11 DIAGNOSIS — N529 Male erectile dysfunction, unspecified: Secondary | ICD-10-CM | POA: Diagnosis not present

## 2023-10-11 DIAGNOSIS — I25119 Atherosclerotic heart disease of native coronary artery with unspecified angina pectoris: Secondary | ICD-10-CM | POA: Diagnosis not present

## 2023-10-11 DIAGNOSIS — R350 Frequency of micturition: Secondary | ICD-10-CM | POA: Diagnosis not present

## 2023-10-11 DIAGNOSIS — N401 Enlarged prostate with lower urinary tract symptoms: Secondary | ICD-10-CM

## 2023-10-11 LAB — URINALYSIS, ROUTINE W REFLEX MICROSCOPIC
Bilirubin, UA: NEGATIVE
Glucose, UA: NEGATIVE
Ketones, UA: NEGATIVE
Leukocytes,UA: NEGATIVE
Nitrite, UA: NEGATIVE
Protein,UA: NEGATIVE
RBC, UA: NEGATIVE
Specific Gravity, UA: 1.015 (ref 1.005–1.030)
Urobilinogen, Ur: 1 mg/dL (ref 0.2–1.0)
pH, UA: 7.5 (ref 5.0–7.5)

## 2023-10-11 LAB — BLADDER SCAN AMB NON-IMAGING: Scan Result: 26

## 2023-10-11 NOTE — Progress Notes (Signed)
 EKG looks good. No changes. Follow-up as scheduled.   Best, Lasalle Pointer, NP

## 2023-10-11 NOTE — Progress Notes (Signed)
 Presents for nurse visit for EKG and vitals per recent phone note and medication changes by Nellie Banas Peck-"Let's stop his Lopressor  and begin Coreg  3.125 mg BID and bring him in 1 week to obtain EKG and vitals"  Medications reviewed. Reports taking all medications as prescribed without side effects. Denies dizziness, chest pain or sob.  Vitals and EKG done and routed to provider for review.

## 2023-10-11 NOTE — Patient Instructions (Addendum)
Medication Instructions:  Your physician recommends that you continue on your current medications as directed. Please refer to the Current Medication list given to you today.  Labwork: none  Testing/Procedures: none  Follow-Up: Your physician recommends that you schedule a follow-up appointment in: as planned  Any Other Special Instructions Will Be Listed Below (If Applicable).  If you need a refill on your cardiac medications before your next appointment, please call your pharmacy.

## 2023-10-15 ENCOUNTER — Encounter: Payer: Self-pay | Admitting: Primary Care

## 2023-10-15 ENCOUNTER — Ambulatory Visit: Admitting: Primary Care

## 2023-10-15 ENCOUNTER — Telehealth: Payer: Self-pay | Admitting: *Deleted

## 2023-10-15 VITALS — BP 162/83 | HR 62 | Ht 65.0 in | Wt 158.2 lb

## 2023-10-15 DIAGNOSIS — K7469 Other cirrhosis of liver: Secondary | ICD-10-CM

## 2023-10-15 DIAGNOSIS — G4719 Other hypersomnia: Secondary | ICD-10-CM | POA: Diagnosis not present

## 2023-10-15 NOTE — Telephone Encounter (Signed)
 Pt called back and changed his mind about having the liver biopsy. He would like to go ahead and have it done as soon as possible.

## 2023-10-15 NOTE — Progress Notes (Signed)
 @Patient  ID: Kyle Boyd, male    DOB: 1951/02/10, 73 y.o.   MRN: [id number]  Chief Complaint  Patient presents with   Establish Care    Referring provider: Lasalle Pointer, NP  HPI: 73 year old male, never smoked. PMH significant for HTN, CAD, hx CABG, BPH, hyperlipidemia.   10/15/2023 Discussed the use of AI scribe software for clinical note transcription with the patient, who gave verbal consent to proceed.  History of Present Illness   Kyle Boyd is a 73 year old male with coronary artery disease and heart failure who presents with daytime sleepiness and disrupted sleep patterns. He was referred by Lasalle Pointer for evaluation of his sleep issues.  He experiences daytime sleepiness and disrupted sleep patterns, characterized by taking naps during the day and waking up with headaches. He wakes up during the night to use the restroom and has been woken by his own snoring. He occasionally struggles to breathe or stops breathing during sleep but has not experienced gasping or choking. He feels tired during the day and has moderate daytime sleepiness with an Epworth Sleepiness Scale score of 12 out of 24.  His typical bedtime is around midnight, and he takes 45 minutes or less to fall asleep. He wakes up once or twice during the night and starts his day at 4:30 AM, resulting in approximately four hours of sleep per night. 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  He has lost 20 pounds unintentionally following a colonoscopy, which revealed three polyps that were not malignant. An endoscopy showed chronic inactive gastritis without H. pylori infection. An abdominal ultrasound in April suggestive of liver cirrhosis.   He quit smoking in 2008, does not consume alcohol , and has no history of drug use. He is retired and lives with his wife.  No history of atrial fibrillation, COPD, or asthma. He has a history of myocardial  infarction and coronary artery bypass grafting, as well as grade 2  diastolic heart failure.      No Known Allergies   There is no immunization history on file for this patient.  Past Medical History:  Diagnosis Date   CAD (coronary artery disease)    Multivessel status post CABG 2010   Carotid artery disease (HCC)    Cirrhosis (HCC)    Cleft palate    s/p repair   Erectile dysfunction    Essential hypertension    PONV (postoperative nausea and vomiting)     Tobacco History: Social History   Tobacco Use  Smoking Status Never  Smokeless Tobacco Never   Counseling given: Not Answered   Outpatient Medications Prior to Visit  Medication Sig Dispense Refill   acetaminophen (TYLENOL) 500 MG tablet Take 500 mg by mouth every 6 (six) hours as needed for moderate pain.     carvedilol  (COREG ) 3.125 MG tablet Take 1 tablet (3.125 mg total) by mouth 2 (two) times daily with a meal. 60 tablet 2   ferrous sulfate 324 MG TBEC Take 324 mg by mouth. Twice daily     GAVILYTE-N WITH FLAVOR PACK 420 g solution Take 4,000 mLs by mouth once.     pantoprazole  (PROTONIX ) 40 MG tablet Take 1 tablet (40 mg total) by mouth 2 (two) times daily. 60 tablet 3   simvastatin (ZOCOR) 40 MG tablet Take 40 mg by mouth at bedtime.     aspirin  EC 81 MG tablet Take 1 tablet (81 mg  total) by mouth daily. (Patient not taking: Reported on 08/10/2023) 90 tablet 3   nitroGLYCERIN  (NITROSTAT ) 0.4 MG SL tablet Place 1 tablet (0.4 mg total) under the tongue every 5 (five) minutes x 3 doses as needed. If no relief after 3rd dose, proceed to the ED for an evaluation 25 tablet 3   No facility-administered medications prior to visit.      Review of Systems  Review of Systems  Constitutional:  Positive for fatigue.  HENT: Negative.    Respiratory: Negative.    Psychiatric/Behavioral:  Positive for sleep disturbance.      Physical Exam  BP (!) 162/83 (BP Location: Left Arm)   Pulse 62   Ht 5\' 5"  (1.651 m)    Wt 158 lb 3.2 oz (71.8 kg)   SpO2 96%   BMI 26.33 kg/m  Physical Exam Constitutional:      Appearance: Normal appearance.  HENT:     Head: Normocephalic and atraumatic.  Cardiovascular:     Rate and Rhythm: Normal rate and regular rhythm.  Pulmonary:     Effort: Pulmonary effort is normal.     Breath sounds: Normal breath sounds.  Musculoskeletal:        General: Normal range of motion.  Skin:    General: Skin is warm and dry.  Neurological:     General: No focal deficit present.     Mental Status: He is alert and oriented to person, place, and time. Mental status is at baseline.  Psychiatric:        Mood and Affect: Mood normal.        Behavior: Behavior normal.        Thought Content: Thought content normal.        Judgment: Judgment normal.      Lab Results:  CBC    Component Value Date/Time   WBC 3.3 (L) 08/29/2023 1043   RBC 4.27 08/29/2023 1043   HGB 12.5 (L) 08/29/2023 1043   HGB 7.9 (L) 04/13/2023 0930   HCT 40.6 08/29/2023 1043   HCT 29.0 (L) 04/13/2023 0930   PLT 72 (L) 08/29/2023 1043   PLT 98 (LL) 04/13/2023 0930   MCV 95.1 08/29/2023 1043   MCV 79 04/13/2023 0930   MCH 29.3 08/29/2023 1043   MCHC 30.8 08/29/2023 1043   RDW 22.6 (H) 08/29/2023 1043   RDW 16.3 (H) 04/13/2023 0930   LYMPHSABS 0.9 08/29/2023 1043   MONOABS 0.3 08/29/2023 1043   EOSABS 0.4 08/29/2023 1043   BASOSABS 0.1 08/29/2023 1043    BMET    Component Value Date/Time   NA 134 (L) 03/06/2009 0435   K 4.2 03/06/2009 0435   CL 102 03/06/2009 0435   CO2 25 03/06/2009 0435   GLUCOSE 102 (H) 03/06/2009 0435   BUN 16 03/06/2009 0435   CREATININE 0.81 03/06/2009 0435   CALCIUM 8.4 03/06/2009 0435   GFRNONAA >60 03/06/2009 0435   GFRAA  03/06/2009 0435    >60        The eGFR has been calculated using the MDRD equation. This calculation has not been validated in all clinical situations. eGFR's persistently <60 mL/min signify possible Chronic Kidney Disease.    BNP No  results found for: "BNP"  ProBNP No results found for: "PROBNP"  Imaging: No results found.   Assessment & Plan:   1. Excessive daytime sleepiness (Primary) - Home sleep test; Future   Assessment and Plan    Excessive daytime sleepiness Presents with symptoms suggestive of sleep  apnea, including daytime sleepiness, snoring, and waking up tired. Epworth Sleepiness Scale score is 12/24, indicating moderate daytime sleepiness. Reports a sleep schedule that may contribute to symptoms, with only about four hours of sleep per night. Discussed risks of untreated sleep apnea, including cardiac arrhythmias, stroke, diabetes, pulmonary hypertension, and potential links to Alzheimer's disease. Explained that treatment options depend on severity, with CPAP as standard for moderate to severe cases, and conservative management or oral appliances for mild cases. - Order home sleep study to evaluate for sleep apnea. - Educate on sleep hygiene, including setting a consistent bedtime and limiting naps to 20-30 minutes. - Discuss positional therapy, including sleeping on the side and using a wedge pillow to elevate the head. - Discuss the potential need for CPAP therapy if sleep apnea is diagnosed and is moderate to severe.  Heart failure Heart failure, currently classified as grade 2. No indication of acute exacerbation at this time. Home sleep study preferred due to insurance guidelines and current heart failure classification.  Myocardial infarction and CABG Myocardial infarction and has undergone coronary artery bypass grafting (CABG). No current symptoms indicating acute cardiac issues. Managed with simvstatin, aspirin  discontinued.   Hypertension Blood pressure readings have been fluctuating, with a recent reading of 160 mmHg. Managed with carvedilol .   Liver cirrhosis Liver cirrhosis suggested by an abdominal ultrasound showing nodular areas and increased liver texture.  - Follow up with  gastroenterologist for further evaluation and management of liver cirrhosis.  Weight loss Reports an unintentional weight loss of 20 pounds following a colonoscopy. Weight loss may be related to liver issues, as suggested by ongoing evaluations for potential autoimmune liver disease.  Chronic inactive gastritis Diagnosis of chronic inactive gastritis, confirmed by endoscopy. No evidence of H. pylori infection.  Follow-up Follow-up is necessary to review the results of the sleep study and ongoing management of liver cirrhosis. - Schedule follow-up appointment in 6-8 weeks to review sleep study results.  Recording duration: 22 minutes     Antonio Baumgarten, NP 10/15/2023

## 2023-10-15 NOTE — Patient Instructions (Addendum)
      VISIT SUMMARY: Today, we discussed your ongoing issues with daytime sleepiness and disrupted sleep patterns. We reviewed your symptoms, including snoring, waking up tired, and your sleep schedule. We also discussed your history of heart failure, myocardial infarction, and recent unintentional weight loss. Additionally, we talked about your liver health and the results of your recent endoscopy and abdominal ultrasound.  YOUR PLAN: -SLEEP APNEA EVALUATION: Sleep apnea is a condition where your breathing stops and starts during sleep, leading to poor sleep quality and daytime tiredness. We will conduct a home sleep study to evaluate for sleep apnea. In the meantime, practice good sleep hygiene by setting a consistent bedtime and limiting naps to 20-30 minutes. Try sleeping on your side and using a wedge pillow to elevate your head. If sleep apnea is diagnosed and is moderate to severe, CPAP therapy may be needed.  -LIVER CIRRHOSIS: Liver cirrhosis is a condition where the liver becomes scarred and its function is impaired. Your abdominal ultrasound showed signs of cirrhosis, and we are evaluating for possible autoimmune liver disease. Follow up with your gastroenterologist for further evaluation and management.  -WEIGHT LOSS: You have experienced an unintentional weight loss of 20 pounds, which may be related to your liver issues. We will continue to monitor your weight and investigate the cause.  INSTRUCTIONS: Please schedule a follow-up appointment in 6-8 weeks to review the results of your sleep study and to continue managing your liver cirrhosis.  Orders: Home sleep study  Follow-up 6-8 weeks to review sleep study results

## 2023-10-18 ENCOUNTER — Ambulatory Visit: Payer: Medicare HMO | Admitting: Urology

## 2023-10-19 ENCOUNTER — Ambulatory Visit: Attending: Nurse Practitioner | Admitting: Nurse Practitioner

## 2023-10-19 ENCOUNTER — Encounter: Payer: Self-pay | Admitting: *Deleted

## 2023-10-19 ENCOUNTER — Encounter: Payer: Self-pay | Admitting: Nurse Practitioner

## 2023-10-19 VITALS — BP 138/86 | HR 56 | Ht 65.0 in | Wt 157.6 lb

## 2023-10-19 DIAGNOSIS — I10 Essential (primary) hypertension: Secondary | ICD-10-CM | POA: Diagnosis not present

## 2023-10-19 DIAGNOSIS — K7460 Unspecified cirrhosis of liver: Secondary | ICD-10-CM

## 2023-10-19 DIAGNOSIS — I25119 Atherosclerotic heart disease of native coronary artery with unspecified angina pectoris: Secondary | ICD-10-CM

## 2023-10-19 DIAGNOSIS — R079 Chest pain, unspecified: Secondary | ICD-10-CM | POA: Diagnosis not present

## 2023-10-19 DIAGNOSIS — R29818 Other symptoms and signs involving the nervous system: Secondary | ICD-10-CM

## 2023-10-19 DIAGNOSIS — E782 Mixed hyperlipidemia: Secondary | ICD-10-CM

## 2023-10-19 NOTE — Patient Instructions (Signed)
 Medication Instructions:   Continue all current medications.   Labwork:  none  Testing/Procedures:  Your physician has requested that you have a lexiscan myoview. For further information please visit [email or web address] Please follow instruction sheet, as given.  Office will contact with results via phone, letter or mychart.     Follow-Up:  8 weeks   Any Other Special Instructions Will Be Listed Below (If Applicable).   If you need a refill on your cardiac medications before your next appointment, please call your pharmacy.

## 2023-10-19 NOTE — Progress Notes (Unsigned)
 Cardiology Office Note:  .   Date:  10/19/2023 ID:  HIRAM MCIVER, DOB 14-Aug-1950, MRN [id number] PCP: Orest Bio, MD  [facility] Providers Cardiologist:  Teddie Favre, MD    History of Present Illness: .   EZZARD DITMER is a 73 y.o. male with a PMH of CAD, s/p CABG in 2010, mixed hyperlipidemia, hypertension, aortic valve sclerosis, BPH, who presents today for scheduled follow-up.  Last seen by Dr. Londa Rival on February 08, 2022.  Was doing well at that time.   07/23/2023 - Today presents for follow-up.  Continues to note fatigue.  Also admits to chest discomfort when he is outside in the cold air/windy outside environment. Denies any recent chest pain, shortness of breath, palpitations, syncope, presyncope, dizziness, orthopnea, PND, swelling or significant weight changes, acute bleeding, or claudication.  09/06/2023 - Today he presents for follow-up. Underwent colonoscopy since I last saw him. Non-bleeding internal hemorrhoids noted.  Two 4 to 5 mm polyps in cecum, removed with cold snare, resected and retrieved.  One 6 mm polyp in the descending colon, removed with a cold snare.  Resected and retrieved.Also  underwent US  of his abdomen that revealed no acute abnormality, nodular contour with increased echotexture of liver, suggestive of cirrhosis, splenomegaly noted. Doing the same since I last saw him.  Continues to note fatigue and chest discomfort only noted with cold air/windy outside environment. Denies any recent chest pain.  He has questions and concerns regarding his most recent abdominal ultrasound.  Blood pressure log reviewed and reveals very well-controlled blood pressure readings.  10/19/2023 - Here for follow-up.  Doing about the same since last office visit. Continues to note fatigue and chest discomfort only noted with cold air/windy outside environment. Denies any recent chest pain.  Denies any shortness of breath, palpitations, syncope, presyncope, dizziness,  orthopnea, PND, swelling or significant weight changes, acute bleeding, or claudication.  He tells me he has not received his sleep study to be performed at home.   ROS: Negative. See HPI.   Studies Reviewed: Aaron Aas    EKG: EKG is not ordered today.  Echo 04/2023: 1. Left ventricular ejection fraction, by estimation, is 60 to 65%. The  left ventricle has normal function. The left ventricle has no regional  wall motion abnormalities. There is mild concentric left ventricular  hypertrophy. Left ventricular diastolic  parameters are consistent with Grade II diastolic dysfunction  (pseudonormalization).   2. Right ventricular systolic function is normal. The right ventricular  size is normal. There is mildly elevated pulmonary artery systolic  pressure. The estimated right ventricular systolic pressure is 38.3 mmHg.   3. The mitral valve is grossly normal. Trivial mitral valve  regurgitation.   4. The aortic valve is tricuspid. There is mild calcification of the  aortic valve. Aortic valve regurgitation is not visualized. Aortic valve  sclerosis/calcification is present, without any evidence of aortic  stenosis. Aortic valve mean gradient  measures 13.0 mmHg. Dimentionless index 0.57.   5. The inferior vena cava is normal in size with greater than 50%  respiratory variability, suggesting right atrial pressure of 3 mmHg.   Comparison(s): Prior images reviewed side by side. LVEF normal at 69-65%  range. Moderately sclerotic aortic valve.  Echo 08/2020:  1. Left ventricular ejection fraction, by estimation, is 60 to 65%. The  left ventricle has normal function. The left ventricle has no regional  wall motion abnormalities. Left ventricular diastolic parameters are  indeterminate.   2. Right ventricular systolic  function is normal. The right ventricular  size is normal.   3. Left atrial size was mildly dilated.   4. The mitral valve is normal in structure. No evidence of mitral valve   regurgitation. No evidence of mitral stenosis.   5. The aortic valve has an indeterminant number of cusps. There is mild  calcification of the aortic valve. There is mild thickening of the aortic  valve. Aortic valve regurgitation is not visualized. No aortic stenosis is  present.   6. The inferior vena cava is normal in size with greater than 50%  respiratory variability, suggesting right atrial pressure of 3 mmHg.  Physical Exam:   VS:  BP 138/86   Pulse (!) 56   Ht 5' 5 (1.651 m)   Wt 157 lb 9.6 oz (71.5 kg)   SpO2 97%   BMI 26.23 kg/m    Wt Readings from Last 3 Encounters:  10/19/23 157 lb 9.6 oz (71.5 kg)  10/15/23 158 lb 3.2 oz (71.8 kg)  10/11/23 160 lb 6.4 oz (72.8 kg)    GEN: Well nourished, well developed in no acute distress NECK: No JVD; No carotid bruits CARDIAC: S1/S2, RRR, Grade 2/6 murmur, no rubs, no gallops RESPIRATORY:  Clear to auscultation without rales, wheezing or rhonchi  ABDOMEN: Soft, non-tender, non-distended EXTREMITIES:  No edema; No deformity   ASSESSMENT AND PLAN: .    CAD, s/p CABG in 2010, chest pain of uncertain etiology Etiology unclear and multifactorial.  Did discuss treatment options with patient, including NST, and came to shared decision that we will proceed with NST. Went over risks vs benefits and he verbalized understanding and is agreeable to proceed. Continue current medication regimen.  Heart healthy diet and regular cardiovascular exercise encouraged.  Care and ED precautions discussed. Came to decision that we will hold off medical therapy at this time -  Imdur would not be a good option as he admits to headache at times.  Informed Consent   Shared Decision Making/Informed Consent The risks [chest pain, shortness of breath, cardiac arrhythmias, dizziness, blood pressure fluctuations, myocardial infarction, stroke/transient ischemic attack, nausea, vomiting, allergic reaction, radiation exposure, metallic taste sensation and  life-threatening complications (estimated to be 1 in 10,000)], benefits (risk stratification, diagnosing coronary artery disease, treatment guidance) and alternatives of a nuclear stress test were discussed in detail with Mr. Karstens and he agrees to proceed.      Mixed hyperlipidemia No recent labs on file. Continue simvastatin. Being managed by PCP. Heart healthy diet and regular cardiovascular exercise encouraged.  Will request labs from PCP.  HTN BP stable. Discussed to monitor BP at home at least 2 hours after medications and sitting for 5-10 minutes. Continue current medication regimen. Heart healthy diet and regular cardiovascular exercise encouraged.  Cirrhosis Continue to f/u with GI and PCP.   Care and ED precautions discussed.  5. Suspected sleep apnea Mildly elevated PASP noted on echocardiogram in December 2024.  STOP-BANG score 6.  Previously referred to pulmonology for OSA evaluation. Will reach out to check on status of sleep study.    Dispo: Follow-up with me/APP in 6-8 weeks or sooner if anything changes.   Signed, Lasalle Pointer, NP

## 2023-10-25 ENCOUNTER — Telehealth (HOSPITAL_COMMUNITY): Payer: Self-pay | Admitting: Surgery

## 2023-10-25 NOTE — Telephone Encounter (Signed)
 I called the pt to remind him of his stress test tomorrow, with his arrival time being 7:45.  I left a VM with the following instructions:  nothing to eat/drink 6 hrs prior to the test, no smoking 8 hrs prior to the test, wear comfortable clothes, do not wear any lotions/oils on chest, hold any medications the physician told you to hold, and check in at the front desk upon arrival.

## 2023-10-26 ENCOUNTER — Encounter (HOSPITAL_COMMUNITY)
Admission: RE | Admit: 2023-10-26 | Discharge: 2023-10-26 | Disposition: A | Discharge status: Home / Self Care | Source: Ambulatory Visit | Attending: Nurse Practitioner | Admitting: Nurse Practitioner

## 2023-10-26 ENCOUNTER — Ambulatory Visit (HOSPITAL_COMMUNITY)
Admission: RE | Admit: 2023-10-26 | Discharge: 2023-10-26 | Disposition: A | Discharge status: Home / Self Care | Source: Ambulatory Visit | Attending: Cardiology | Admitting: Cardiology

## 2023-10-26 DIAGNOSIS — R079 Chest pain, unspecified: Secondary | ICD-10-CM | POA: Diagnosis not present

## 2023-10-26 LAB — NM MYOCAR MULTI W/SPECT W/WALL MOTION / EF
Base ST Depression (mm): 0 mm
Estimated workload: 1
Exercise duration (min): 0 min
Exercise duration (sec): 0 s
LV dias vol: 83 mL (ref 62–150)
LV sys vol: 27 mL (ref 4.2–5.8)
MPHR: 145 {beats}/min
Nuc Stress EF: 67 %
Peak HR: 96 {beats}/min
Percent HR: 66 %
RATE: 0.4
Rest HR: 56 {beats}/min
Rest Nuclear Isotope Dose: 11 mCi
SDS: 1
SRS: 3
SSS: 4
ST Depression (mm): 0 mm
Stress Nuclear Isotope Dose: 32.4 mCi
TID: 0.95

## 2023-10-26 MED ORDER — TECHNETIUM TC 99M TETROFOSMIN IV KIT
30.0000 | PACK | Freq: Once | INTRAVENOUS | Status: AC | PRN
Start: 2023-10-26 — End: 2023-10-26
  Administered 2023-10-26: 32.4 via INTRAVENOUS

## 2023-10-26 MED ORDER — REGADENOSON 0.4 MG/5ML IV SOLN
INTRAVENOUS | Status: AC
  Administered 2023-10-26: 0.4 mg via INTRAVENOUS
  Filled 2023-10-26: qty 5

## 2023-10-26 MED ORDER — SODIUM CHLORIDE FLUSH 0.9 % IV SOLN
INTRAVENOUS | Status: AC
  Filled 2023-10-26: qty 10

## 2023-10-26 MED ORDER — TECHNETIUM TC 99M TETROFOSMIN IV KIT
10.0000 | PACK | Freq: Once | INTRAVENOUS | Status: AC | PRN
  Administered 2023-10-26: 11 via INTRAVENOUS

## 2023-10-29 ENCOUNTER — Ambulatory Visit: Payer: Self-pay | Admitting: Nurse Practitioner

## 2023-11-04 NOTE — Telephone Encounter (Signed)
 Kyle Boyd:  Please arrange liver biopsy.   Dx: Cirrhosis, positive ASMA.

## 2023-11-05 NOTE — Addendum Note (Signed)
 Addended by: JEANELL GRAEME RAMAN on: 11/05/2023 08:28 AM   Modules accepted: Orders

## 2023-11-05 NOTE — Telephone Encounter (Signed)
 Order placed. Central scheduling will call to schedule

## 2023-11-08 ENCOUNTER — Telehealth: Payer: Self-pay | Admitting: *Deleted

## 2023-11-08 NOTE — Telephone Encounter (Signed)
 Called patient. Snap study has been sent but they cannot reach the patient. I called patient and left VM informing him what was needed and the best contact number for [facility] to complete his study. NFN

## 2023-11-08 NOTE — Telephone Encounter (Signed)
 Copied from CRM [id number]. Topic: Appointments - Appointment Scheduling >> Nov 08, 2023 12:19 PM Rilla B wrote: Patient received notice from MyChart to schedule appt. Patient states he called for his machine and no one has called him back. Patient states he thinks he is suppose to have his machine and then set up follow up appt after that. Patient needs someone to call him and help him understand the process of getting his equipement and the timeframe for his appt with [facility].  Please call patient.

## 2023-11-14 ENCOUNTER — Encounter

## 2023-11-14 DIAGNOSIS — G4730 Sleep apnea, unspecified: Secondary | ICD-10-CM | POA: Diagnosis not present

## 2023-11-14 DIAGNOSIS — G4719 Other hypersomnia: Secondary | ICD-10-CM

## 2023-11-21 ENCOUNTER — Telehealth: Payer: Self-pay

## 2023-11-21 ENCOUNTER — Other Ambulatory Visit: Payer: Self-pay | Admitting: Radiology

## 2023-11-21 DIAGNOSIS — E8770 Fluid overload, unspecified: Secondary | ICD-10-CM

## 2023-11-21 NOTE — Telephone Encounter (Signed)
 Copied from CRM [id number]. Topic: Appointments - Scheduling Inquiry for Clinic >> Nov 19, 2023  9:06 AM Kyle Boyd wrote: Reason for CRM:   Pt is contacting clinic to schedule his 6-8 week FU for sleep study results. Unable to find appt avail in [place], and he is not open to traveling to another clinic. Contacted [facility] who confirmed they see no avail through Oct at [facility] location, with [facility] or new NP [name]. Was advised by [facility] to contact provider's nurse to see if scheduling is possible, or if results can be reviewed over the phone. Pt was comfortable with going over results by phone.   Requests call back  CB# [phone]  Called and spoke with the patient. Scheduled ov 7/22 with [name] to review hst results. Nfn Routing to [facility] to see if we have results back.

## 2023-11-21 NOTE — Telephone Encounter (Signed)
 Spoke with Delon at [facility] they received the test back yesterday and they are going to put it in as a rush to have it processed before his ov. NFN

## 2023-11-22 ENCOUNTER — Ambulatory Visit (HOSPITAL_COMMUNITY)
Admission: RE | Admit: 2023-11-22 | Discharge: 2023-11-22 | Disposition: A | Discharge status: Home / Self Care | Source: Ambulatory Visit | Attending: Gastroenterology | Admitting: Gastroenterology

## 2023-11-22 ENCOUNTER — Other Ambulatory Visit: Payer: Self-pay | Admitting: Gastroenterology

## 2023-11-22 ENCOUNTER — Other Ambulatory Visit: Payer: Self-pay

## 2023-11-22 DIAGNOSIS — Z8719 Personal history of other diseases of the digestive system: Secondary | ICD-10-CM | POA: Diagnosis not present

## 2023-11-22 DIAGNOSIS — K7460 Unspecified cirrhosis of liver: Secondary | ICD-10-CM | POA: Diagnosis not present

## 2023-11-22 DIAGNOSIS — K7469 Other cirrhosis of liver: Secondary | ICD-10-CM | POA: Diagnosis not present

## 2023-11-22 DIAGNOSIS — E8770 Fluid overload, unspecified: Secondary | ICD-10-CM

## 2023-11-22 DIAGNOSIS — K7689 Other specified diseases of liver: Secondary | ICD-10-CM | POA: Diagnosis not present

## 2023-11-22 HISTORY — PX: IR VENOGRAM HEPATIC W HEMODYNAMIC EVALUATION: IMG692

## 2023-11-22 HISTORY — PX: IR TRANSCATHETER BX: IMG713

## 2023-11-22 HISTORY — PX: IR US GUIDE VASC ACCESS RIGHT: IMG2390

## 2023-11-22 LAB — PROTIME-INR
INR: 1.2 (ref 0.8–1.2)
Prothrombin Time: 16 s — ABNORMAL HIGH (ref 11.4–15.2)

## 2023-11-22 LAB — CBC
HCT: 45.4 % (ref 39.0–52.0)
Hemoglobin: 15.1 g/dL (ref 13.0–17.0)
MCH: 30.4 pg (ref 26.0–34.0)
MCHC: 33.3 g/dL (ref 30.0–36.0)
MCV: 91.3 fL (ref 80.0–100.0)
Platelets: 53 K/uL — ABNORMAL LOW (ref 150–400)
RBC: 4.97 MIL/uL (ref 4.22–5.81)
RDW: 13.8 % (ref 11.5–15.5)
WBC: 2.5 K/uL — ABNORMAL LOW (ref 4.0–10.5)
nRBC: 0 % (ref 0.0–0.2)

## 2023-11-22 MED ORDER — FENTANYL CITRATE (PF) 100 MCG/2ML IJ SOLN
INTRAMUSCULAR | Status: AC
  Filled 2023-11-22: qty 2

## 2023-11-22 MED ORDER — MIDAZOLAM HCL 2 MG/2ML IJ SOLN
INTRAMUSCULAR | Status: AC | PRN
Start: 2023-11-22 — End: 2023-11-22
  Administered 2023-11-22: .5 mg via INTRAVENOUS
  Administered 2023-11-22: 1 mg via INTRAVENOUS

## 2023-11-22 MED ORDER — LIDOCAINE HCL 1 % IJ SOLN: 20.0000 mL | Freq: Once | INTRAMUSCULAR | Status: DC

## 2023-11-22 MED ORDER — MIDAZOLAM HCL 2 MG/2ML IJ SOLN
INTRAMUSCULAR | Status: AC
  Filled 2023-11-22: qty 2

## 2023-11-22 MED ORDER — LIDOCAINE-EPINEPHRINE (PF) 1 %-1:200000 IJ SOLN: 10.0000 mL | Freq: Once | INTRAMUSCULAR | Status: DC

## 2023-11-22 MED ORDER — SODIUM CHLORIDE 0.9% FLUSH: 3.0000 mL | Freq: Two times a day (BID) | INTRAVENOUS | Status: DC

## 2023-11-22 MED ORDER — SODIUM CHLORIDE 0.9% FLUSH: 3.0000 mL | INTRAVENOUS | Status: DC | PRN

## 2023-11-22 MED ORDER — FENTANYL CITRATE (PF) 100 MCG/2ML IJ SOLN
INTRAMUSCULAR | Status: AC | PRN
  Administered 2023-11-22: 50 ug via INTRAVENOUS
  Administered 2023-11-22: 25 ug via INTRAVENOUS

## 2023-11-22 MED ORDER — IOHEXOL 300 MG/ML  SOLN: 50.0000 mL | Freq: Once | INTRAMUSCULAR | Status: DC | PRN

## 2023-11-22 MED ORDER — LIDOCAINE-EPINEPHRINE 1 %-1:100000 IJ SOLN
INTRAMUSCULAR | Status: AC
  Filled 2023-11-22: qty 1

## 2023-11-22 NOTE — Discharge Instructions (Signed)
 Drink plenty of fluids over the next 2-3 days.  Needle Biopsy, Care After These instructions tell you how to care for yourself after your procedure. Your doctor may give you more instructions. Call your doctor if you have any problems or questions. What can I expect after the procedure? After a needle biopsy, it is common to have these things at the puncture site: Soreness. Bruising. Mild pain. These things should go away after a few days. Follow these instructions at home: Puncture site care  Wash your hands with soap and water for at least 20 seconds before and after you change your bandage (dressing). If you cannot use soap and water, use hand sanitizer. Follow instructions from your doctor about how to take care of your puncture site. This includes: Remove Dressing in 24 hours.  You May Shower In 24 Hours. DO NOT SCRUB over site.  Check your puncture site every day for signs of infection. Check for: Redness, swelling, or more pain. Fluid or blood. Warmth. Pus or a bad smell. General instructions Go back to your normal activities when your doctor says that it is safe. Ask if there is anything you cannot do as you heal. Take over-the-counter and prescription medicines only as told by your doctor. Do not take baths, swim, or use a hot tub for at least 5 days.  Keep all follow-up visits. Ask if you need an appointment to get your biopsy results. Contact a doctor if: You have a fever. You have redness, swelling, or more pain at the puncture site, and it lasts longer than a few days. You have fluid, blood, or pus coming from the puncture site. Your puncture site feels warm. Get help right away if: You have very bad bleeding from the puncture site. Summary After the procedure, it is common to have soreness, bruising, or mild pain at the puncture site. Check your puncture site every day for signs of infection, such as redness, swelling, or more pain. Get help right away if you have  very bad bleeding from your puncture site. This information is not intended to replace advice given to you by your health care provider. Make sure you discuss any questions you have with your health care provider. Document Revised: 10/13/2020 Document Reviewed: 10/13/2020 Elsevier Patient Education  2024 [employer].

## 2023-11-22 NOTE — Procedures (Signed)
 Cirrhosis   Vascular and Interventional Radiology Procedure Note  Patient: Kyle Boyd DOB: 1950/11/03 Medical Record Number: [id number] Note Date/Time: 11/22/23 12:52 PM   Performing Physician: Thom Hall, MD Assistant(s): None  Diagnosis: Cirrhosis   Procedure(s):  TRANSJUGULAR LIVER BIOPSY HEPATIC VENOGRAPHY  Anesthesia: Conscious Sedation Complications: None Estimated Blood Loss: Minimal Specimens: Pathology  Findings:  - access via the RIGHT jugular veins. - portal HTN with portosystemic gradient of 14 (1 RA and 15 HVWP) - Transvenous liver biopsy, with 3 cores obtained   Plan: - Bedrest x1 hrs. No HOB restrictions.  Final report to follow once all images are reviewed and compared with previous studies.  See detailed dictation with images in PACS. The patient tolerated the procedure well without incident or complication and was returned to Recovery in stable condition.    Thom Hall, MD [facility] Radiology   Pager. [phone] Clinic. [phone]

## 2023-11-22 NOTE — H&P (Addendum)
 Chief Complaint:   Referring Provider(s): Rudy Josette RAMAN   Supervising Physician: Hughes Simmonds  Patient Status: [facility] - Out-pt  History of Present Illness: Kyle Boyd is a 73 y.o. male with PMH significant for EtOH use, cirrhosis (MELD 9 on 5/8), IDA. He has experienced chronic thrombocytopenia and anemia. 10/03/23 GI note states plan for EGD and colonoscopy in future, most recent scopes prior to that 09/03/23 (Grade 2 esophageal varices, polyps, nonbleeding internal hemorrhoids, pathology with 1 tubular adenoma, polypoid colonic mucosa without cytologic dysplasia removed from the cecum).   A liver biopsy was ordered as part of ongoing workup for cirrhosis. Positive ASMA. Case approved with TG approach.   Confirms NPO since MN  and ride/supervision available for 24 hours.  No longer taking ASA.  Denies fever, chills, SOB, CP, sore throat, N/V, abd pain, blood in stool or urine, abnormal bruising, leg swelling, back pain. He recently experienced diarrhea and HA/runny nose but states symptoms have since improved.   Allergies Reviewed:  Patient has no known allergies.    Patient is Full Code  Past Medical History:  Diagnosis Date   CAD (coronary artery disease)    Multivessel status post CABG 2010   Carotid artery disease (HCC)    Cirrhosis (HCC)    Cleft palate    s/p repair   Erectile dysfunction    Essential hypertension    PONV (postoperative nausea and vomiting)     Past Surgical History:  Procedure Laterality Date   CATARACT EXTRACTION W/PHACO Left 01/09/2020   Procedure: CATARACT EXTRACTION PHACO AND INTRAOCULAR LENS PLACEMENT (IOC);  Surgeon: Harrie Agent, MD;  Location: [facility];  Service: Ophthalmology;  Laterality: Left;  CDE: 6.99   CATARACT EXTRACTION W/PHACO Right 01/23/2020   Procedure: CATARACT EXTRACTION PHACO AND INTRAOCULAR LENS PLACEMENT RIGHT EYE;  Surgeon: Harrie Agent, MD;  Location: [facility];  Service: Ophthalmology;  Laterality: Right;   CDE: 6.11   CLEFT PALATE REPAIR     COLONOSCOPY N/A 09/03/2023   Surgeon: Cindie Dunnings K, DO; nonbleeding internal hemorrhoids, two 4-5 mm polyps in the cecum, one 6 mm polyp in the descending colon.  Pathology with 1 tubular adenoma, polypoid colonic mucosa with bowel cytologic dysplasia.  Recommended 5-year surveillance.   CORONARY ARTERY BYPASS GRAFT  02/2009   Dr. Fleeta Ochoa - LIMA to LAD, SVG to diagonal, SVG to OM, SVG to RCA   ESOPHAGOGASTRODUODENOSCOPY N/A 09/03/2023   Surgeon: Cindie Dunnings POUR, DO; grade 2 esophageal varices, portal hypertensive gastropathy, gastritis biopsy.  Pathology with chronic inactive gastritis, no H. pylori.   plastic ear drum      right      Medications: Prior to Admission medications   Medication Sig Start Date End Date Taking? Authorizing Provider  acetaminophen (TYLENOL) 500 MG tablet Take 500 mg by mouth every 6 (six) hours as needed for moderate pain.    [provider]  carvedilol  (COREG ) 3.125 MG tablet Take 1 tablet (3.125 mg total) by mouth 2 (two) times daily with a meal. 10/04/23   Miriam Norris, NP  ferrous sulfate 324 MG TBEC Take 324 mg by mouth daily. Twice daily    [provider]  nitroGLYCERIN  (NITROSTAT ) 0.4 MG SL tablet Place 0.4 mg under the tongue every 5 (five) minutes as needed for chest pain.    [provider]  pantoprazole  (PROTONIX ) 40 MG tablet Take 1 tablet (40 mg total) by mouth 2 (two) times daily. 10/03/23   Rudy Josette RAMAN, PA-C  simvastatin (  ZOCOR) 40 MG tablet Take 40 mg by mouth at bedtime.    [provider]     Family History  Problem Relation Age of Onset   Heart disease Mother    Colon cancer Neg Hx    Stomach cancer Neg Hx     Social History   Socioeconomic History   Marital status: Married    Spouse name: Not on file   Number of children: 2   Years of education: Not on file   Highest education level: Not on file  Occupational History   Occupation: full time     Comment: Cabin crew facility in [place]   Tobacco Use   Smoking status: Never   Smokeless tobacco: Never  Vaping Use   Vaping status: Never Used  Substance and Sexual Activity   Alcohol  use: No    Alcohol /week: 0.0 standard drinks of alcohol    Drug use: No   Sexual activity: Not on file  Other Topics Concern   Not on file  Social History Narrative   Not on file   Social Drivers of Health   Financial Resource Strain: Not on file  Food Insecurity: Not on file  Transportation Needs: Not on file  Physical Activity: Not on file  Stress: Not on file  Social Connections: Not on file     Review of Systems: A 12 point ROS discussed and pertinent positives are indicated in the HPI above.  All other systems are negative.    Vital Signs: BP (!) 140/70 (BP Location: Right Arm)   Pulse 62   Temp 98.7 F (37.1 C)   Resp 18   Ht 5' 5 (1.651 m)   Wt 151 lb (68.5 kg)   SpO2 96%   BMI 25.13 kg/m   Advance Care Plan: No documents on file    Physical Exam HENT:     Mouth/Throat:     Mouth: Mucous membranes are moist.     Pharynx: Oropharynx is clear.  Cardiovascular:     Rate and Rhythm: Normal rate and regular rhythm.     Pulses: Normal pulses.     Heart sounds: Murmur heard.  Pulmonary:     Effort: Pulmonary effort is normal.     Breath sounds: Normal breath sounds.  Abdominal:     Palpations: Abdomen is soft.     Tenderness: There is no abdominal tenderness.  Skin:    General: Skin is warm and dry.     Comments: No wounds or rash to neck  Neurological:     Mental Status: He is alert and oriented to person, place, and time.  Psychiatric:        Mood and Affect: Mood normal.        Behavior: Behavior normal.        Thought Content: Thought content normal.        Judgment: Judgment normal.     Imaging: NM Myocar Multi W/Spect W/Wall Motion / EF Result Date: 10/26/2023   Stress ECG is negative for ischemia. Rare PVCs noted.   LV perfusion is  normal. There is no evidence of ischemia. There is no evidence of infarction.   Left ventricular function is normal. Nuclear stress EF: 67%.   Findings are consistent with no ischemia and no infarction. The study is low risk.    Labs:  CBC: Recent Labs    04/13/23 0930 08/29/23 1043 11/22/23 1049  WBC 4.3 3.3* 2.5*  HGB 7.9* 12.5* 15.1  HCT 29.0* 40.6 45.4  PLT 98* 72* 53*    COAGS: Recent Labs    11/22/23 1049  INR 1.2    BMP: No results for input(s): NA, K, CL, CO2, GLUCOSE, BUN, CALCIUM, CREATININE, GFRNONAA, GFRAA in the last 8760 hours.  Invalid input(s): CMP  LIVER FUNCTION TESTS: No results for input(s): BILITOT, AST, ALT, ALKPHOS, PROT, ALBUMIN in the last 8760 hours.  TUMOR MARKERS: No results for input(s): AFPTM, CEA, CA199, CHROMGRNA in the last 8760 hours.  Assessment and Plan:  Request for  image guided TG liver biopsy approved for 7/17. No contraindications for procedure identified in ROS, physical exam, or review of pre-sedation considerations. Labs reviewed and within acceptable range. Plt >50. INR 1.2.  4/30 US  imaging available and reviewed VSS, afebrile Not on a blood thinner.    Risks and benefits of image guided TG liver biopsy was discussed with the patient and/or patient's family including, but not limited to bleeding, infection, damage to adjacent structures or low yield requiring additional tests.  All of the questions were answered and there is agreement to proceed.  Consent signed and in chart.   Thank you for allowing our service to participate in Kyle Boyd 's care.    Electronically Signed: Laymon Coast, NP   11/22/2023, 12:05 PM     I spent a total of  15 Minutes   in face to face in clinical consultation, greater than 50% of which was counseling/coordinating care for image guided TG liver biopsy.    (A copy of this note was sent to the referring provider and the time of  visit.)

## 2023-11-22 NOTE — Sedation Documentation (Signed)
 Pressures for this case: RA: 1 Wedge: 15

## 2023-11-26 LAB — SURGICAL PATHOLOGY

## 2023-11-27 ENCOUNTER — Ambulatory Visit

## 2023-11-28 DIAGNOSIS — G4733 Obstructive sleep apnea (adult) (pediatric): Secondary | ICD-10-CM | POA: Diagnosis not present

## 2023-12-02 ENCOUNTER — Ambulatory Visit: Payer: Self-pay | Admitting: Gastroenterology

## 2023-12-04 ENCOUNTER — Ambulatory Visit: Payer: Self-pay | Admitting: Primary Care

## 2023-12-04 NOTE — Progress Notes (Signed)
 Please let patient know HST showed very mild OSA, he had average 5.7 events per hour. No significant oxygen desaturations. Unless symptomatic he does not need CPAP

## 2023-12-11 NOTE — Progress Notes (Unsigned)
   Established Patient Pulmonology Office Visit   Subjective:  Patient ID: Kyle Boyd, male    DOB: 18-Mar-1951  MRN: [id number]  CC: No chief complaint on file.   HPI  73 year old male, never smoked. PMH significant for HTN, CAD, hx CABG, BPH, hyperlipidemia.   Underwent home sleep testing and AHI was 5.4 indicative of mild sleep apnea.  Sleep history The patient goes to bed at *** PM and falls asleep within *** minutes. They have *** awakenings at night due to ***. They do/do not fall asleep easily after returning to bed. The patient does/does not report snoring. The patient does/does not report witnessed apneas. The patient does/does not report morning headaches or dry mouth. The patient does/does not complain of excessive daytime sleepiness. The patient does/does not take naps. The patient gets out of bed at ***AM. The patient does/does not drink *** cups of coffee per day.   The Epworth Sleepiness score is ***/24.   STOP BANG: [ ]  Snoring - do you snore loudly? [ ]  Tired - do you feel tired, fatigued, or sleepy during daytime? [ ]  Observed apneas? [ ]  Pressure - hx of high blood pressure? [ ]  BMI? [ ]  Age > 50? [ ]  Neck circumference: > 16 inch or 40 cm collar? [ ]  Male Gender  Total - ***/8 (>2 moderate risk, >4 high risk)   {PULM QUESTIONNAIRES (Optional):33196}  ROS  {History (Optional):23778}  Current Outpatient Medications:    acetaminophen (TYLENOL) 500 MG tablet, Take 500 mg by mouth every 6 (six) hours as needed for moderate pain., Disp: , Rfl:    carvedilol  (COREG ) 3.125 MG tablet, Take 1 tablet (3.125 mg total) by mouth 2 (two) times daily with a meal., Disp: 60 tablet, Rfl: 2   ferrous sulfate 324 MG TBEC, Take 324 mg by mouth daily. Twice daily, Disp: , Rfl:    nitroGLYCERIN  (NITROSTAT ) 0.4 MG SL tablet, Place 0.4 mg under the tongue every 5 (five) minutes as needed for chest pain., Disp: , Rfl:    pantoprazole  (PROTONIX ) 40 MG tablet, Take 1 tablet (40 mg total)  by mouth 2 (two) times daily., Disp: 60 tablet, Rfl: 3   simvastatin (ZOCOR) 40 MG tablet, Take 40 mg by mouth at bedtime., Disp: , Rfl:       Objective:  There were no vitals taken for this visit. {Pulm Vitals (Optional):32837}  Physical Exam   Diagnostic Review:  {Labs (Optional):32838}     Assessment & Plan:   Assessment & Plan Excessive daytime sleepiness   No orders of the defined types were placed in this encounter.     No follow-ups on file.   Starr Engel, MD               Dacota Devall, MD

## 2023-12-12 ENCOUNTER — Encounter: Payer: Self-pay | Admitting: Pulmonary Disease

## 2023-12-12 ENCOUNTER — Ambulatory Visit: Admitting: Pulmonary Disease

## 2023-12-12 VITALS — BP 138/75 | HR 59 | Temp 98.8°F | Ht 65.0 in | Wt 149.0 lb

## 2023-12-12 DIAGNOSIS — G4719 Other hypersomnia: Secondary | ICD-10-CM | POA: Diagnosis not present

## 2023-12-12 DIAGNOSIS — Z23 Encounter for immunization: Secondary | ICD-10-CM | POA: Diagnosis not present

## 2023-12-12 DIAGNOSIS — G4733 Obstructive sleep apnea (adult) (pediatric): Secondary | ICD-10-CM

## 2023-12-12 DIAGNOSIS — R062 Wheezing: Secondary | ICD-10-CM | POA: Diagnosis not present

## 2023-12-12 MED ORDER — ALBUTEROL SULFATE HFA 108 (90 BASE) MCG/ACT IN AERS: 2.0000 | INHALATION_SPRAY | Freq: Four times a day (QID) | RESPIRATORY_TRACT | 6 refills | Status: AC | PRN

## 2023-12-12 NOTE — Patient Instructions (Signed)
-   Does not need to be treated for sleep apnea - Can use albuterol  as needed for wheezing - Follow up in a year.

## 2023-12-12 NOTE — Addendum Note (Signed)
 Addended by: RUFFUS LUKES T on: 12/12/2023 10:02 AM   Modules accepted: Orders

## 2023-12-14 ENCOUNTER — Ambulatory Visit: Attending: Nurse Practitioner | Admitting: Nurse Practitioner

## 2023-12-14 ENCOUNTER — Encounter: Payer: Self-pay | Admitting: Nurse Practitioner

## 2023-12-14 VITALS — BP 118/64 | HR 62 | Ht 65.0 in | Wt 148.0 lb

## 2023-12-14 DIAGNOSIS — N529 Male erectile dysfunction, unspecified: Secondary | ICD-10-CM

## 2023-12-14 DIAGNOSIS — I10 Essential (primary) hypertension: Secondary | ICD-10-CM | POA: Diagnosis not present

## 2023-12-14 DIAGNOSIS — I2510 Atherosclerotic heart disease of native coronary artery without angina pectoris: Secondary | ICD-10-CM

## 2023-12-14 DIAGNOSIS — E782 Mixed hyperlipidemia: Secondary | ICD-10-CM

## 2023-12-14 DIAGNOSIS — G4733 Obstructive sleep apnea (adult) (pediatric): Secondary | ICD-10-CM

## 2023-12-14 DIAGNOSIS — K7460 Unspecified cirrhosis of liver: Secondary | ICD-10-CM

## 2023-12-14 NOTE — Patient Instructions (Addendum)
 Medication Instructions:   Your physician recommends that you continue on your current medications as directed. Please refer to the Current Medication list given to you today.   Labwork: None today  Testing/Procedures: None today  Follow-Up: 6 months with Almarie Miriam PIETY  Any Other Special Instructions Will Be Listed Below (If Applicable).  If you need a refill on your cardiac medications before your next appointment, please call your pharmacy.   Mediterranean Diet A Mediterranean diet is based on the traditions of countries on the [employer]. It focuses on eating more: Fruits and vegetables. Whole grains, beans, nuts, and seeds. Heart-healthy fats. These are fats that are good for your heart. It involves eating less: Dairy. Meat and eggs. Processed foods with added sugar, salt, and fat. This type of diet can help prevent certain conditions. It can also improve outcomes if you have a long-term (chronic) disease, such as kidney or heart disease. What are tips for following this plan? Reading food labels Check packaged foods for: The serving size. For foods such as rice and pasta, the serving size is the amount of cooked product, not dry. The total fat. Avoid foods with saturated fat or trans fat. Added sugars, such as corn syrup. Shopping  Try to have a balanced diet. Buy a variety of foods, such as: Fresh fruits and vegetables. You may be able to get these from local farmers markets. You can also buy them frozen. Grains, beans, nuts, and seeds. Some of these can be bought in bulk. Fresh seafood. Poultry and eggs. Low-fat dairy products. Buy whole ingredients instead of foods that have already been packaged. If you can't get fresh seafood, buy precooked frozen shrimp or canned fish, such as tuna, salmon, or sardines. Stock your pantry so you always have certain foods on hand, such as olive oil, canned tuna, canned tomatoes, rice, pasta, and beans. Cooking Cook  foods with extra-virgin olive oil instead of using butter or other vegetable oils. Have meat as a side dish. Have vegetables or grains as your main dish. This means having meat in small portions or adding small amounts of meat to foods like pasta or stew. Use beans or vegetables instead of meat in common dishes like chili or lasagna. Try out different cooking methods. Try roasting, broiling, steaming, and sauting vegetables. Add frozen vegetables to soups, stews, pasta, or rice. Add nuts or seeds for added healthy fats and plant protein at each meal. You can add these to yogurt, salads, or vegetable dishes. Marinate fish or vegetables using olive oil, lemon juice, garlic, and fresh herbs. Meal planning Plan to eat a vegetarian meal one day each week. Try to work up to two vegetarian meals, if possible. Eat seafood two or more times a week. Have healthy snacks on hand. These may include: Vegetable sticks with hummus. Greek yogurt. Fruit and nut trail mix. Eat balanced meals. These should include: Fruit: 2-3 servings a day. Vegetables: 4-5 servings a day. Low-fat dairy: 2 servings a day. Fish, poultry, or lean meat: 1 serving a day. Beans and legumes: 2 or more servings a week. Nuts and seeds: 1-2 servings a day. Whole grains: 6-8 servings a day. Extra-virgin olive oil: 3-4 servings a day. Limit red meat and sweets to just a few servings a month. Lifestyle  Try to cook and eat meals with your family. Drink enough fluid to keep your pee (urine) pale yellow. Be active every day. This includes: Aerobic exercise, which is exercise that causes your heart to beat  faster. Examples include running and swimming. Leisure activities like gardening, walking, or housework. Get 7-8 hours of sleep each night. Drink red wine if your provider says you can. A glass of wine is 5 oz (150 mL). You may be allowed to have: Up to 1 glass a day if you're male and not pregnant. Up to 2 glasses a day if  you're male. What foods should I eat? Fruits Apples. Apricots. Avocado. Berries. Bananas. Cherries. Dates. Figs. Grapes. Lemons. Melon. Oranges. Peaches. Plums. Pomegranate. Vegetables Artichokes. Beets. Broccoli. Cabbage. Carrots. Eggplant. Green beans. Chard. Kale. Spinach. Onions. Leeks. Peas. Squash. Tomatoes. Peppers. Radishes. Grains Whole-grain pasta. Brown rice. Bulgur wheat. Polenta. Couscous. Whole-wheat bread. [place] Madeira. Meats and other proteins Beans. Almonds. Sunflower seeds. Pine nuts. Peanuts. Cod. Salmon. Scallops. Shrimp. Tuna. Tilapia. Clams. Oysters. Eggs. Chicken or [place] without skin. Dairy Low-fat milk. Cheese. Greek yogurt. Fats and oils Extra-virgin olive oil. Avocado oil. Grapeseed oil. Beverages Water . Red wine. Herbal tea. Sweets and desserts Greek yogurt with honey. Baked apples. Poached pears. Trail mix. Seasonings and condiments Basil. Cilantro. Coriander. Cumin. Mint. Parsley. Sage. Rosemary. Tarragon. Garlic. Oregano. Thyme. Pepper. Balsamic vinegar. Tahini. Hummus. Tomato sauce. Olives. Mushrooms. The items listed above may not be all the foods and drinks you can have. Talk to a dietitian to learn more. What foods should I limit? This is a list of foods that should be eaten rarely. Fruits Fruit canned in syrup. Vegetables Deep-fried potatoes, like [place] fries. Grains Packaged pasta or rice dishes. Cereal with added sugar. Snacks with added sugar. Meats and other proteins Beef. Pork. Lamb. Chicken or [place] with skin. Hot dogs. [place]. Dairy Ice cream. Sour cream. Whole milk. Fats and oils Butter. Canola oil. Vegetable oil. Beef fat (tallow). Lard. Beverages Juice. Sugar-sweetened soft drinks. Beer. Liquor and spirits. Sweets and desserts Cookies. Cakes. Pies. Candy. Seasonings and condiments Mayonnaise. Pre-made sauces and marinades. The items listed above may not be all the foods and drinks you should limit. Talk to a dietitian to  learn more. Where to find more information American Heart Association (AHA): heart.org This information is not intended to replace advice given to you by your health care provider. Make sure you discuss any questions you have with your health care provider. Document Revised: 08/06/2022 Document Reviewed: 08/06/2022 Elsevier Patient Education  2024 [employer].

## 2023-12-14 NOTE — Progress Notes (Signed)
 Cardiology Office Note:  .   Date:  12/14/2023 ID:  Kyle Boyd, DOB 11/02/1950, MRN [id number] PCP: Atilano Deward ORN, MD  [facility] Providers Cardiologist:  Jayson Sierras, MD    History of Present Illness: .   Kyle Boyd is a 73 y.o. male with a PMH of CAD, s/p CABG in 2010, mixed hyperlipidemia, hypertension, aortic valve sclerosis, BPH, who presents today for scheduled follow-up.  Last seen by Dr. Sierras on February 08, 2022.  Was doing well at that time.   07/23/2023 - Today presents for follow-up.  Continues to note fatigue.  Also admits to chest discomfort when he is outside in the cold air/windy outside environment. Denies any recent chest pain, shortness of breath, palpitations, syncope, presyncope, dizziness, orthopnea, PND, swelling or significant weight changes, acute bleeding, or claudication.  09/06/2023 - Today he presents for follow-up. Underwent colonoscopy since I last saw him. Non-bleeding internal hemorrhoids noted.  Two 4 to 5 mm polyps in cecum, removed with cold snare, resected and retrieved.  One 6 mm polyp in the descending colon, removed with a cold snare.  Resected and retrieved.Also  underwent US  of his abdomen that revealed no acute abnormality, nodular contour with increased echotexture of liver, suggestive of cirrhosis, splenomegaly noted. Doing the same since I last saw him.  Continues to note fatigue and chest discomfort only noted with cold air/windy outside environment. Denies any recent chest pain.  He has questions and concerns regarding his most recent abdominal ultrasound.  Blood pressure log reviewed and reveals very well-controlled blood pressure readings.  10/19/2023 - Here for follow-up.  Doing about the same since last office visit. Continues to note fatigue and chest discomfort only noted with cold air/windy outside environment. Denies any recent chest pain.  Denies any shortness of breath, palpitations, syncope, presyncope, dizziness,  orthopnea, PND, swelling or significant weight changes, acute bleeding, or claudication.  He tells me he has not received his sleep study to be performed at home.  12/14/2023 - He is here for follow-up. Continues to follow with GI. Wanting to know the results of his  liver biopsy. Overall doing well from a cardiac perspective. Does admit to some issues with erectile dysfunction. Denies any chest pain, shortness of breath, palpitations, syncope, presyncope, dizziness, orthopnea, PND, swelling or significant weight changes, acute bleeding, or claudication.                               ROS: Negative. See HPI.   Studies Reviewed: SABRA    EKG: EKG is not ordered today.  NST 10/2023:    Stress ECG is negative for ischemia. Rare PVCs noted.   LV perfusion is normal. There is no evidence of ischemia. There is no evidence of infarction.   Left ventricular function is normal. Nuclear stress EF: 67%.   Findings are consistent with no ischemia and no infarction. The study is low risk.  Echo 04/2023: 1. Left ventricular ejection fraction, by estimation, is 60 to 65%. The  left ventricle has normal function. The left ventricle has no regional  wall motion abnormalities. There is mild concentric left ventricular  hypertrophy. Left ventricular diastolic  parameters are consistent with Grade II diastolic dysfunction  (pseudonormalization).   2. Right ventricular systolic function is normal. The right ventricular  size is normal. There is mildly elevated pulmonary artery systolic  pressure. The estimated right ventricular systolic pressure is 38.3 mmHg.  3. The mitral valve is grossly normal. Trivial mitral valve  regurgitation.   4. The aortic valve is tricuspid. There is mild calcification of the  aortic valve. Aortic valve regurgitation is not visualized. Aortic valve  sclerosis/calcification is present, without any evidence of aortic  stenosis. Aortic valve mean gradient  measures 13.0 mmHg.  Dimentionless index 0.57.   5. The inferior vena cava is normal in size with greater than 50%  respiratory variability, suggesting right atrial pressure of 3 mmHg.   Comparison(s): Prior images reviewed side by side. LVEF normal at 69-65%  range. Moderately sclerotic aortic valve.  Echo 08/2020:  1. Left ventricular ejection fraction, by estimation, is 60 to 65%. The  left ventricle has normal function. The left ventricle has no regional  wall motion abnormalities. Left ventricular diastolic parameters are  indeterminate.   2. Right ventricular systolic function is normal. The right ventricular  size is normal.   3. Left atrial size was mildly dilated.   4. The mitral valve is normal in structure. No evidence of mitral valve  regurgitation. No evidence of mitral stenosis.   5. The aortic valve has an indeterminant number of cusps. There is mild  calcification of the aortic valve. There is mild thickening of the aortic  valve. Aortic valve regurgitation is not visualized. No aortic stenosis is  present.   6. The inferior vena cava is normal in size with greater than 50%  respiratory variability, suggesting right atrial pressure of 3 mmHg.  Physical Exam:   VS:  BP 118/64 (BP Location: Right Arm, Cuff Size: Normal)   Pulse 62   Ht 5' 5 (1.651 m)   Wt 148 lb (67.1 kg)   SpO2 96%   BMI 24.63 kg/m    Wt Readings from Last 3 Encounters:  12/14/23 148 lb (67.1 kg)  12/12/23 149 lb (67.6 kg)  11/22/23 151 lb (68.5 kg)    GEN: Well nourished, well developed in no acute distress NECK: No JVD; No carotid bruits CARDIAC: S1/S2, RRR, Grade 2/6 murmur, no rubs, no gallops RESPIRATORY:  Clear to auscultation without rales, wheezing or rhonchi  ABDOMEN: Soft, non-tender, non-distended EXTREMITIES:  No edema; No deformity   ASSESSMENT AND PLAN: .    CAD, s/p CABG in 2010 Denies any recent CP or anginal symptoms. Most recent NST reviewed with pt, did not reveal ischemia or infarction.  Study found to be low risk.  Continue current medication regimen.  Heart healthy diet and regular cardiovascular exercise encouraged.  Care and ED precautions discussed.   Mixed hyperlipidemia No recent labs on file. Continue simvastatin. Being managed by PCP. Heart healthy diet and regular cardiovascular exercise encouraged.  Will request labs from PCP.  HTN BP stable. Discussed to monitor BP at home at least 2 hours after medications and sitting for 5-10 minutes. Continue current medication regimen. Heart healthy diet and regular cardiovascular exercise encouraged.  Cirrhosis Continue to f/u with GI and PCP.   Care and ED precautions discussed.  5. Mild OSA Sleep study revealed mild OSA. No requiring for CPAP. Continue to f/u with PCP.   6. Erectile dysfunction Etiology multifactorial. Recommended to f/u with PCP regarding this.  I spent a total duration of 30 minutes reviewing prior notes, reviewing outside records including  labs, face-to-face counseling of  medical condition, pathophysiology, evaluation, management, and documenting the findings in the note.    Dispo: Follow-up with MD/APP in 6 months or sooner if anything changes.   Signed, Almarie Crate,  NP

## 2023-12-19 DIAGNOSIS — L570 Actinic keratosis: Secondary | ICD-10-CM | POA: Diagnosis not present

## 2023-12-19 DIAGNOSIS — L821 Other seborrheic keratosis: Secondary | ICD-10-CM | POA: Diagnosis not present

## 2023-12-21 DIAGNOSIS — I8500 Esophageal varices without bleeding: Secondary | ICD-10-CM | POA: Diagnosis not present

## 2023-12-21 DIAGNOSIS — M545 Low back pain, unspecified: Secondary | ICD-10-CM | POA: Diagnosis not present

## 2023-12-26 NOTE — Progress Notes (Unsigned)
 Referring Provider: Atilano Deward ORN, MD Primary Care Physician:  Atilano Deward ORN, MD Primary GI Physician: Dr. Cindie  No chief complaint on file.   HPI:   Kyle Boyd is a 73 y.o. male with history of CAD s/p CABG, carotid artery disease, cleft palate, HTN, thrombocytopenia, IDA likely secondary to portal hypertensive gastropathy, adenomatous colon polyps, newly diagnosed cirrhosis in April 2025, presenting today for follow-up.  Last seen in the office 10/03/2023.  Clinically, patient doing well from a cirrhosis standpoint.  He had no overt GI bleeding.  He was taking oral iron but had not started PPI twice daily as recommended after EGD in April.  Recommended updating labs to evaluate etiology of cirrhosis, start pantoprazole  40 mg twice daily, continue iron daily, and would check with cardiology to see if we could switch metoprolol  to carvedilol  due to esophageal varices.  Recommended 38-month follow-up.  Labs completed 10/08/2023: ASMA weakly positive at 27, mildly elevated IgA of 461.  AMA, ANA, IgG, IgM within normal limits. Serum alpha-1 antitrypsin within normal limits. Ceruloplasmin normal. Hepatitis A, B, C serologies all negative with no immunity to hepatitis A or B.  Liver biopsy 11/22/2023 showing cirrhosis with no clear etiology.  Reviewed with Dr. Cindie.  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   Today:  IDA:  Reviewed most recent labs 11/22/2023 with hemoglobin improved to 15.1. Iron:  Pantoprazole :  NSAIDs:   Cirrhosis:  MELD 3.0: 9 on 09/13/23 EGD: 09/03/2023 with grade 2 esophageal varices, portal hypertensive gastropathy, gastritis. BB: On carvedilol  3.125 mg twice daily. Ascites/peripheral edema: No Diuretics: None Paracentesis: None History of SBP: None Encephalopathy:  None.      EtOH: No Illicit drug use: No Tattoos: No Hepatitis exposure:  No Supplements/herbals: No Family history of liver  disease: Uncle with ETOH liver disease.  Family or personal history of autoimmune conditions: None.       Past Medical History:  Diagnosis Date   CAD (coronary artery disease)    Multivessel status post CABG 2010   Carotid artery disease (HCC)    Cirrhosis (HCC)    Cleft palate    s/p repair   Erectile dysfunction    Essential hypertension    History of heart bypass surgery    PONV (postoperative nausea and vomiting)     Past Surgical History:  Procedure Laterality Date   CATARACT EXTRACTION W/PHACO Left 01/09/2020   Procedure: CATARACT EXTRACTION PHACO AND INTRAOCULAR LENS PLACEMENT (IOC);  Surgeon: Harrie Agent, MD;  Location: [facility];  Service: Ophthalmology;  Laterality: Left;  CDE: 6.99   CATARACT EXTRACTION W/PHACO Right 01/23/2020   Procedure: CATARACT EXTRACTION PHACO AND INTRAOCULAR LENS PLACEMENT RIGHT EYE;  Surgeon: Harrie Agent, MD;  Location: [facility];  Service: Ophthalmology;  Laterality: Right;  CDE: 6.11   CLEFT PALATE REPAIR     COLONOSCOPY N/A 09/03/2023   Surgeon: Cindie Dunnings K, DO; nonbleeding internal hemorrhoids, two 4-5 mm polyps in the cecum, one 6 mm polyp in the descending colon.  Pathology with 1 tubular adenoma, polypoid colonic mucosa with bowel cytologic dysplasia.  Recommended 5-year surveillance.   CORONARY ARTERY BYPASS GRAFT  02/2009   Dr. Fleeta Ochoa - LIMA to LAD, SVG to diagonal, SVG to OM, SVG to RCA   ESOPHAGOGASTRODUODENOSCOPY N/A 09/03/2023   Surgeon: Cindie Dunnings POUR, DO; grade 2 esophageal varices, portal hypertensive gastropathy, gastritis biopsy.  Pathology with chronic inactive gastritis, no H. pylori.   IR TRANSCATHETER BX  11/22/2023   IR US  GUIDE VASC ACCESS RIGHT  11/22/2023   IR VENOGRAM HEPATIC W HEMODYNAMIC EVALUATION  11/22/2023   plastic ear drum      right    Current Outpatient Medications  Medication Sig Dispense Refill   acetaminophen (TYLENOL) 500 MG tablet Take 500 mg by mouth every 6 (six) hours as needed for  moderate pain.     albuterol  (VENTOLIN  HFA) 108 (90 Base) MCG/ACT inhaler Inhale 2 puffs into the lungs every 6 (six) hours as needed for wheezing or shortness of breath. 8 g 6   carvedilol  (COREG ) 3.125 MG tablet Take 1 tablet (3.125 mg total) by mouth 2 (two) times daily with a meal. 60 tablet 2   ferrous sulfate 324 MG TBEC Take 324 mg by mouth daily. Twice daily     nitroGLYCERIN  (NITROSTAT ) 0.4 MG SL tablet Place 0.4 mg under the tongue every 5 (five) minutes as needed for chest pain.     pantoprazole  (PROTONIX ) 40 MG tablet Take 1 tablet (40 mg total) by mouth 2 (two) times daily. 60 tablet 3   simvastatin (ZOCOR) 40 MG tablet Take 40 mg by mouth at bedtime.     No current facility-administered medications for this visit.    Allergies as of 12/27/2023 - Review Complete 12/14/2023  Allergen Reaction Noted   Poison oak extract  12/12/2023    Family History  Problem Relation Age of Onset   Heart disease Mother    Colon cancer Neg Hx    Stomach cancer Neg Hx     Social History   Socioeconomic History   Marital status: Married    Spouse name: Not on file   Number of children: 2   Years of education: Not on file   Highest education level: Not on file  Occupational History   Occupation: full time    Comment: Cabin crew facility in [place]   Tobacco Use   Smoking status: Never   Smokeless tobacco: Never  Vaping Use   Vaping status: Never Used  Substance and Sexual Activity   Alcohol  use: No    Alcohol /week: 0.0 standard drinks of alcohol    Drug use: No   Sexual activity: Not on file  Other Topics Concern   Not on file  Social History Narrative   Not on file   Social Drivers of Health   Financial Resource Strain: Not on file  Food Insecurity: Not on file  Transportation Needs: Not on file  Physical Activity: Not on file  Stress: Not on file  Social Connections: Not on file    Review of Systems: Gen: Denies fever, chills, anorexia. Denies fatigue,  weakness, weight loss.  CV: Denies chest pain, palpitations, syncope, peripheral edema, and claudication. Resp: Denies dyspnea at rest, cough, wheezing, coughing up blood, and pleurisy. GI: Denies vomiting blood, jaundice, and fecal incontinence.   Denies dysphagia or odynophagia. Derm: Denies rash, itching, dry skin Psych: Denies depression, anxiety, memory loss, confusion. No homicidal or suicidal ideation.  Heme: Denies bruising, bleeding, and enlarged lymph nodes.  Physical Exam: There were no vitals taken for this visit. General:   Alert and oriented. No distress noted. Pleasant and cooperative.  Head:  Normocephalic and atraumatic. Eyes:  Conjuctiva clear without scleral icterus. Heart:  S1, S2 present without murmurs appreciated. Lungs:  Clear to auscultation bilaterally. No wheezes, rales, or rhonchi. No distress.  Abdomen:  +BS, soft, non-tender and non-distended. No rebound or guarding. No HSM or masses noted. Msk:  Symmetrical without gross  deformities. Normal posture. Extremities:  Without edema. Neurologic:  Alert and  oriented x4 Psych:  Normal mood and affect.    Assessment:     Plan:  ***   Josette Centers, PA-C [facility] 12/27/2023

## 2023-12-27 ENCOUNTER — Ambulatory Visit: Admitting: Gastroenterology

## 2023-12-27 ENCOUNTER — Encounter: Payer: Self-pay | Admitting: Gastroenterology

## 2023-12-27 VITALS — BP 127/75 | HR 65 | Temp 97.9°F | Ht 65.0 in | Wt 145.8 lb

## 2023-12-27 DIAGNOSIS — D509 Iron deficiency anemia, unspecified: Secondary | ICD-10-CM

## 2023-12-27 DIAGNOSIS — K3189 Other diseases of stomach and duodenum: Secondary | ICD-10-CM | POA: Diagnosis not present

## 2023-12-27 DIAGNOSIS — I8500 Esophageal varices without bleeding: Secondary | ICD-10-CM | POA: Diagnosis not present

## 2023-12-27 DIAGNOSIS — D696 Thrombocytopenia, unspecified: Secondary | ICD-10-CM

## 2023-12-27 DIAGNOSIS — K7460 Unspecified cirrhosis of liver: Secondary | ICD-10-CM | POA: Diagnosis not present

## 2023-12-27 DIAGNOSIS — K7469 Other cirrhosis of liver: Secondary | ICD-10-CM

## 2023-12-27 DIAGNOSIS — Z860101 Personal history of adenomatous and serrated colon polyps: Secondary | ICD-10-CM

## 2023-12-27 NOTE — Patient Instructions (Signed)
 I am glad you are doing well overall!   Please have the CMP I ordered today completed with your upcoming blood work for Dr. Atilano.   We will arrange for you to have an ultrasound in November 2025.   Continue Pantoprazole  40 mg twice a day.   Continue carvedilol  3.125 mg twice a day.   Nutrition:  High-protein diet from a primarily plant-based diet. Avoid red meat.  No raw or undercooked meat, seafood, or shellfish. Low-fat/cholesterol/carbohydrate diet. Limit sodium to no more than 2000 mg/day including everything that you eat and drink. Recommend at least 30 minutes of aerobic and resistance exercise 3 days/week.  We will see you back in 6 months or sooner if needed.   Josette Centers, PA-C [facility]

## 2023-12-28 ENCOUNTER — Other Ambulatory Visit: Payer: Self-pay | Admitting: Nurse Practitioner

## 2024-01-01 ENCOUNTER — Other Ambulatory Visit: Payer: Self-pay

## 2024-01-01 MED ORDER — CARVEDILOL 3.125 MG PO TABS: 3.1250 mg | ORAL_TABLET | Freq: Two times a day (BID) | ORAL | 3 refills | Status: AC

## 2024-01-03 DIAGNOSIS — R748 Abnormal levels of other serum enzymes: Secondary | ICD-10-CM | POA: Diagnosis not present

## 2024-01-03 DIAGNOSIS — D649 Anemia, unspecified: Secondary | ICD-10-CM | POA: Diagnosis not present

## 2024-01-03 DIAGNOSIS — E7849 Other hyperlipidemia: Secondary | ICD-10-CM | POA: Diagnosis not present

## 2024-01-03 DIAGNOSIS — R739 Hyperglycemia, unspecified: Secondary | ICD-10-CM | POA: Diagnosis not present

## 2024-01-10 ENCOUNTER — Ambulatory Visit: Admitting: Gastroenterology

## 2024-01-10 DIAGNOSIS — Z6823 Body mass index (BMI) 23.0-23.9, adult: Secondary | ICD-10-CM | POA: Diagnosis not present

## 2024-01-10 DIAGNOSIS — D509 Iron deficiency anemia, unspecified: Secondary | ICD-10-CM | POA: Diagnosis not present

## 2024-01-10 DIAGNOSIS — Z1331 Encounter for screening for depression: Secondary | ICD-10-CM | POA: Diagnosis not present

## 2024-01-10 DIAGNOSIS — D696 Thrombocytopenia, unspecified: Secondary | ICD-10-CM | POA: Diagnosis not present

## 2024-01-10 DIAGNOSIS — Z1389 Encounter for screening for other disorder: Secondary | ICD-10-CM | POA: Diagnosis not present

## 2024-01-10 DIAGNOSIS — I10 Essential (primary) hypertension: Secondary | ICD-10-CM | POA: Diagnosis not present

## 2024-01-10 DIAGNOSIS — I2510 Atherosclerotic heart disease of native coronary artery without angina pectoris: Secondary | ICD-10-CM | POA: Diagnosis not present

## 2024-01-10 DIAGNOSIS — Z0000 Encounter for general adult medical examination without abnormal findings: Secondary | ICD-10-CM | POA: Diagnosis not present

## 2024-01-10 DIAGNOSIS — Z0001 Encounter for general adult medical examination with abnormal findings: Secondary | ICD-10-CM | POA: Diagnosis not present

## 2024-02-04 ENCOUNTER — Other Ambulatory Visit: Payer: Self-pay | Admitting: Gastroenterology

## 2024-02-04 DIAGNOSIS — D509 Iron deficiency anemia, unspecified: Secondary | ICD-10-CM

## 2024-02-07 ENCOUNTER — Telehealth: Payer: Self-pay | Admitting: Internal Medicine

## 2024-02-07 ENCOUNTER — Telehealth: Payer: Self-pay | Admitting: *Deleted

## 2024-02-07 DIAGNOSIS — D509 Iron deficiency anemia, unspecified: Secondary | ICD-10-CM

## 2024-02-07 MED ORDER — PANTOPRAZOLE SODIUM 40 MG PO TBEC: 40.0000 mg | DELAYED_RELEASE_TABLET | Freq: Two times a day (BID) | ORAL | 3 refills | Status: AC

## 2024-02-07 NOTE — Addendum Note (Signed)
 Addended by: EZZARD SONNY RAMAN on: 02/07/2024 02:16 PM   Modules accepted: Orders

## 2024-02-07 NOTE — Telephone Encounter (Signed)
 Received refill request for pantoprazole  40mg . Pt last OV 02/04/2024.

## 2024-02-07 NOTE — Telephone Encounter (Signed)
 Pt has seen Dr. Cindie and Josette Centers. Patient came in office he stated he was suppose to have his medicine Pantoprazole  40 mg called in few days ago he said he still could not get his medicine at pharmacy.

## 2024-02-07 NOTE — Telephone Encounter (Signed)
 Kyle Boyd has already sent to [name] hospital

## 2024-02-12 DIAGNOSIS — Z23 Encounter for immunization: Secondary | ICD-10-CM | POA: Diagnosis not present

## 2024-02-21 ENCOUNTER — Encounter: Payer: Self-pay | Admitting: Gastroenterology

## 2024-02-28 ENCOUNTER — Other Ambulatory Visit: Payer: Self-pay | Admitting: *Deleted

## 2024-02-28 ENCOUNTER — Telehealth: Payer: Self-pay | Admitting: *Deleted

## 2024-02-28 DIAGNOSIS — K7469 Other cirrhosis of liver: Secondary | ICD-10-CM

## 2024-02-28 NOTE — Telephone Encounter (Signed)
 Pt came office. US  scheduled for Tuesday 03/11/24 at [facility], arrive at 9:15 am to check in, NPO after midnight. Pt also asked the immunizations that he was supposed to get. Informed pt that provider had wanted him to get Hepatits A/B vaccinations and he could get those that the pharmacy or his PCP office. Pt verbalized understanding.

## 2024-02-28 NOTE — Telephone Encounter (Signed)
 LMTRC  Pt left vm stating he received a letter to schedule RUQ US  and would like some more information.

## 2024-03-11 ENCOUNTER — Ambulatory Visit (HOSPITAL_COMMUNITY)
Admission: RE | Admit: 2024-03-11 | Discharge: 2024-03-11 | Disposition: A | Discharge status: Home / Self Care | Source: Ambulatory Visit | Attending: Internal Medicine | Admitting: Internal Medicine

## 2024-03-11 DIAGNOSIS — K7469 Other cirrhosis of liver: Secondary | ICD-10-CM | POA: Diagnosis present

## 2024-03-11 DIAGNOSIS — K7460 Unspecified cirrhosis of liver: Secondary | ICD-10-CM | POA: Diagnosis not present

## 2024-03-11 DIAGNOSIS — K7689 Other specified diseases of liver: Secondary | ICD-10-CM | POA: Diagnosis not present

## 2024-04-23 DIAGNOSIS — Z23 Encounter for immunization: Secondary | ICD-10-CM | POA: Diagnosis not present

## 2024-04-28 ENCOUNTER — Ambulatory Visit: Payer: Self-pay | Admitting: Internal Medicine

## 2024-05-06 NOTE — Progress Notes (Signed)
 NIC'd the repeat US 

## 2024-06-20 ENCOUNTER — Ambulatory Visit: Admitting: Nurse Practitioner

## 2024-07-08 ENCOUNTER — Ambulatory Visit: Admitting: Gastroenterology

## 2024-10-10 ENCOUNTER — Ambulatory Visit: Admitting: Urology
